# Patient Record
Sex: Male | Born: 1935
Health system: Southern US, Community
[De-identification: ages and names within clinical notes are randomized; demographics above are authoritative.]

## PROBLEM LIST (undated history)

## (undated) DIAGNOSIS — M1990 Unspecified osteoarthritis, unspecified site: Secondary | ICD-10-CM

## (undated) DIAGNOSIS — I10 Essential (primary) hypertension: Secondary | ICD-10-CM

## (undated) DIAGNOSIS — G4730 Sleep apnea, unspecified: Secondary | ICD-10-CM

## (undated) HISTORY — PX: OTHER SURGICAL HISTORY: SHX169

## (undated) HISTORY — PX: LIPOMA EXCISION: SHX5283

---

## 1998-11-28 ENCOUNTER — Encounter: Admission: RE | Admit: 1998-11-28 | Discharge: 1999-02-26 | Payer: Self-pay | Admitting: General Practice

## 1998-12-13 ENCOUNTER — Encounter: Payer: Self-pay | Admitting: Surgery

## 1998-12-13 ENCOUNTER — Ambulatory Visit (HOSPITAL_COMMUNITY): Admission: RE | Admit: 1998-12-13 | Discharge: 1998-12-13 | Payer: Self-pay | Admitting: Surgery

## 2003-02-16 ENCOUNTER — Emergency Department (HOSPITAL_COMMUNITY): Admission: EM | Admit: 2003-02-16 | Discharge: 2003-02-16 | Payer: Self-pay | Admitting: Emergency Medicine

## 2005-03-11 ENCOUNTER — Emergency Department (HOSPITAL_COMMUNITY): Admission: EM | Admit: 2005-03-11 | Discharge: 2005-03-12 | Payer: Self-pay | Admitting: Emergency Medicine

## 2005-03-20 ENCOUNTER — Ambulatory Visit (HOSPITAL_COMMUNITY): Admission: RE | Admit: 2005-03-20 | Discharge: 2005-03-20 | Payer: Self-pay | Admitting: General Surgery

## 2010-03-24 ENCOUNTER — Emergency Department (HOSPITAL_COMMUNITY)
Admission: EM | Admit: 2010-03-24 | Discharge: 2010-03-25 | Payer: Self-pay | Source: Home / Self Care | Admitting: Emergency Medicine

## 2010-04-01 LAB — BASIC METABOLIC PANEL
BUN: 10 mg/dL (ref 6–23)
CO2: 27 mEq/L (ref 19–32)
Calcium: 8.7 mg/dL (ref 8.4–10.5)
Chloride: 101 mEq/L (ref 96–112)
Creatinine, Ser: 1.12 mg/dL (ref 0.4–1.5)
GFR calc Af Amer: >60 mL/min (ref >60)
GFR calc non Af Amer: >60 mL/min (ref >60)
Glucose, Bld: 164 mg/dL — ABNORMAL HIGH (ref 70–99)
Potassium: 3.4 mEq/L — ABNORMAL LOW (ref 3.5–5.1)
Sodium: 136 mEq/L (ref 135–145)

## 2010-04-01 LAB — DIFFERENTIAL
Basophils Absolute: 0 10*3/uL (ref 0.0–0.1)
Basophils Relative: 0 % (ref 0–1)
Eosinophils Absolute: 0.2 10*3/uL (ref 0.0–0.7)
Eosinophils Relative: 3 % (ref 0–5)
Lymphocytes Relative: 12 % (ref 12–46)
Lymphs Abs: 0.8 10*3/uL (ref 0.7–4.0)
Monocytes Absolute: 0.5 10*3/uL (ref 0.1–1.0)
Monocytes Relative: 7 % (ref 3–12)
Neutro Abs: 4.7 10*3/uL (ref 1.7–7.7)
Neutrophils Relative %: 77 % (ref 43–77)

## 2010-04-01 LAB — CBC
HCT: 42.2 % (ref 39.0–52.0)
Hemoglobin: 14.3 g/dL (ref 13.0–17.0)
MCH: 29.7 pg (ref 26.0–34.0)
MCHC: 33.9 g/dL (ref 30.0–36.0)
MCV: 87.7 fL (ref 78.0–100.0)
Platelets: 180 10*3/uL (ref 150–400)
RBC: 4.81 MIL/uL (ref 4.22–5.81)
RDW: 13.7 % (ref 11.5–15.5)
WBC: 6.1 10*3/uL (ref 4.0–10.5)

## 2011-06-17 ENCOUNTER — Encounter (HOSPITAL_COMMUNITY): Payer: Self-pay | Admitting: Pharmacy Technician

## 2011-06-17 NOTE — Patient Instructions (Signed)
[address]  06/17/2011   Your procedure is scheduled on:   06/19/2011  Report to [facility] at  1215  AM.  Call this number if you have problems the morning of surgery: [phone]   Remember:   Do not eat food:After Midnight.  May have clear liquids:until Midnight .  Clear liquids include soda, tea, black coffee, apple or grape juice, broth.  Take these medicines the morning of surgery with A SIP OF WATER: cardura,vasotec,benicar   Do not wear jewelry, make-up or nail polish.  Do not wear lotions, powders, or perfumes. You may wear deodorant.  Do not shave 48 hours prior to surgery.  Do not bring valuables to the hospital.  Contacts, dentures or bridgework may not be worn into surgery.  Leave suitcase in the car. After surgery it may be brought to your room.  For patients admitted to the hospital, checkout time is 11:00 AM the day of discharge.   Patients discharged the day of surgery will not be allowed to drive home.  Name and phone number of your driver: family  Special Instructions: N/A   Please read over the following fact sheets that you were given: Pain Booklet, Surgical Site Infection Prevention, Anesthesia Post-op Instructions and Care and Recovery After Surgery Cataract A cataract is a clouding of the lens of the eye. When a lens becomes cloudy, vision is reduced based on the degree and nature of the clouding. Many cataracts reduce vision to some degree. Some cataracts make people more near-sighted as they develop. Other cataracts increase glare. Cataracts that are ignored and become worse can sometimes look white. The white color can be seen through the pupil. CAUSES   Aging. However, cataracts may occur at any age, even in newborns.   Certain drugs.   Trauma to the eye.   Certain diseases such as diabetes.   Specific eye diseases such as chronic inflammation inside the eye or a sudden attack of a rare form of glaucoma.   Inherited or acquired medical problems.    SYMPTOMS   Gradual, progressive drop in vision in the affected eye.   Severe, rapid visual loss. This most often happens when trauma is the cause.  DIAGNOSIS  To detect a cataract, an eye doctor examines the lens. Cataracts are best diagnosed with an exam of the eyes with the pupils enlarged (dilated) by drops.  TREATMENT  For an early cataract, vision may improve by using different eyeglasses or stronger lighting. If that does not help your vision, surgery is the only effective treatment. A cataract needs to be surgically removed when vision loss interferes with your everyday activities, such as driving, reading, or watching TV. A cataract may also have to be removed if it prevents examination or treatment of another eye problem. Surgery removes the cloudy lens and usually replaces it with a substitute lens (intraocular lens, IOL).  At a time when both you and your doctor agree, the cataract will be surgically removed. If you have cataracts in both eyes, only one is usually removed at a time. This allows the operated eye to heal and be out of danger from any possible problems after surgery (such as infection or poor wound healing). In rare cases, a cataract may be doing damage to your eye. In these cases, your caregiver may advise surgical removal right away. The vast majority of people who have cataract surgery have better vision afterward. HOME CARE INSTRUCTIONS  If you are not planning surgery, you may be  asked to do the following:  Use different eyeglasses.   Use stronger or brighter lighting.   Ask your eye doctor about reducing your medicine dose or changing medicines if it is thought that a medicine caused your cataract. Changing medicines does not make the cataract go away on its own.   Become familiar with your surroundings. Poor vision can lead to injury. Avoid bumping into things on the affected side. You are at a higher risk for tripping or falling.   Exercise extreme care when  driving or operating machinery.   Wear sunglasses if you are sensitive to bright light or experiencing problems with glare.  SEEK IMMEDIATE MEDICAL CARE IF:   You have a worsening or sudden vision loss.   You notice redness, swelling, or increasing pain in the eye.   You have a fever.  Document Released: 03/03/2005 Document Revised: 02/20/2011 Document Reviewed: 10/25/2010 [facility] Patient Information 2012 [place].PATIENT INSTRUCTIONS POST-ANESTHESIA  IMMEDIATELY FOLLOWING SURGERY:  Do not drive or operate machinery for the first twenty four hours after surgery.  Do not make any important decisions for twenty four hours after surgery or while taking narcotic pain medications or sedatives.  If you develop intractable nausea and vomiting or a severe headache please notify your doctor immediately.  FOLLOW-UP:  Please make an appointment with your surgeon as instructed. You do not need to follow up with anesthesia unless specifically instructed to do so.  WOUND CARE INSTRUCTIONS (if applicable):  Keep a dry clean dressing on the anesthesia/puncture wound site if there is drainage.  Once the wound has quit draining you may leave it open to air.  Generally you should leave the bandage intact for twenty four hours unless there is drainage.  If the epidural site drains for more than 36-48 hours please call the anesthesia department.  QUESTIONS?:  Please feel free to call your physician or the hospital operator if you have any questions, and they will be happy to assist you.     [place] [phone]

## 2011-06-18 ENCOUNTER — Other Ambulatory Visit: Payer: Self-pay

## 2011-06-18 ENCOUNTER — Encounter (HOSPITAL_COMMUNITY)
Admission: RE | Admit: 2011-06-18 | Discharge: 2011-06-18 | Disposition: A | Discharge status: Home / Self Care | Payer: Medicare Other | Source: Ambulatory Visit | Attending: Ophthalmology | Admitting: Ophthalmology

## 2011-06-18 ENCOUNTER — Encounter (HOSPITAL_COMMUNITY): Payer: Self-pay

## 2011-06-18 HISTORY — DX: Unspecified osteoarthritis, unspecified site: M19.90

## 2011-06-18 HISTORY — DX: Essential (primary) hypertension: I10

## 2011-06-18 HISTORY — DX: Sleep apnea, unspecified: G47.30

## 2011-06-18 LAB — BASIC METABOLIC PANEL
BUN: 16 mg/dL (ref 6–23)
CO2: 26 mEq/L (ref 19–32)
Calcium: 9.1 mg/dL (ref 8.4–10.5)
Chloride: 105 mEq/L (ref 96–112)
Creatinine, Ser: 1.04 mg/dL (ref 0.50–1.35)
GFR calc Af Amer: 80 mL/min — ABNORMAL LOW (ref >90)
GFR calc non Af Amer: 69 mL/min — ABNORMAL LOW (ref >90)
Glucose, Bld: 145 mg/dL — ABNORMAL HIGH (ref 70–99)
Potassium: 3.6 mEq/L (ref 3.5–5.1)
Sodium: 141 mEq/L (ref 135–145)

## 2011-06-18 LAB — HEMOGLOBIN AND HEMATOCRIT, BLOOD
HCT: 39.5 % (ref 39.0–52.0)
Hemoglobin: 13.3 g/dL (ref 13.0–17.0)

## 2011-06-18 NOTE — Progress Notes (Signed)
06/18/11 1204  OBSTRUCTIVE SLEEP APNEA  Score 4 or greater  Updated health history

## 2011-06-19 ENCOUNTER — Ambulatory Visit (HOSPITAL_COMMUNITY): Payer: Medicare Other | Admitting: Anesthesiology

## 2011-06-19 ENCOUNTER — Encounter (HOSPITAL_COMMUNITY)
Admission: RE | Disposition: A | Discharge status: Home / Self Care | Payer: Self-pay | Source: Ambulatory Visit | Attending: Ophthalmology

## 2011-06-19 ENCOUNTER — Ambulatory Visit (HOSPITAL_COMMUNITY)
Admission: RE | Admit: 2011-06-19 | Discharge: 2011-06-19 | Disposition: A | Discharge status: Home / Self Care | Payer: Medicare Other | Source: Ambulatory Visit | Attending: Ophthalmology | Admitting: Ophthalmology

## 2011-06-19 ENCOUNTER — Encounter (HOSPITAL_COMMUNITY): Payer: Self-pay | Admitting: Anesthesiology

## 2011-06-19 ENCOUNTER — Encounter (HOSPITAL_COMMUNITY): Payer: Self-pay | Admitting: *Deleted

## 2011-06-19 DIAGNOSIS — Z01810 Encounter for preprocedural cardiovascular examination: Secondary | ICD-10-CM | POA: Insufficient documentation

## 2011-06-19 DIAGNOSIS — H2589 Other age-related cataract: Secondary | ICD-10-CM | POA: Insufficient documentation

## 2011-06-19 DIAGNOSIS — Z01812 Encounter for preprocedural laboratory examination: Secondary | ICD-10-CM | POA: Insufficient documentation

## 2011-06-19 DIAGNOSIS — I10 Essential (primary) hypertension: Secondary | ICD-10-CM | POA: Insufficient documentation

## 2011-06-19 DIAGNOSIS — Z79899 Other long term (current) drug therapy: Secondary | ICD-10-CM | POA: Insufficient documentation

## 2011-06-19 DIAGNOSIS — E119 Type 2 diabetes mellitus without complications: Secondary | ICD-10-CM | POA: Insufficient documentation

## 2011-06-19 HISTORY — PX: CATARACT EXTRACTION W/PHACO: SHX586

## 2011-06-19 SURGERY — PHACOEMULSIFICATION, CATARACT, WITH IOL INSERTION
Anesthesia: Monitor Anesthesia Care | Site: Eye | Laterality: Left | Wound class: Clean

## 2011-06-19 MED ORDER — LIDOCAINE HCL (PF) 1 % IJ SOLN
INTRAMUSCULAR | Status: AC
  Filled 2011-06-19: qty 2

## 2011-06-19 MED ORDER — NEOMYCIN-POLYMYXIN-DEXAMETH 0.1 % OP OINT
TOPICAL_OINTMENT | OPHTHALMIC | Status: DC | PRN
  Administered 2011-06-19: 1 via OPHTHALMIC

## 2011-06-19 MED ORDER — MIDAZOLAM HCL 2 MG/2ML IJ SOLN
1.0000 mg | INTRAMUSCULAR | Status: DC | PRN
  Administered 2011-06-19: 2 mg via INTRAVENOUS

## 2011-06-19 MED ORDER — LIDOCAINE HCL 3.5 % OP GEL
1.0000 "application " | Freq: Once | OPHTHALMIC | Status: AC
  Administered 2011-06-19: 1 via OPHTHALMIC

## 2011-06-19 MED ORDER — LIDOCAINE 3.5 % OP GEL OPTIME - NO CHARGE
OPHTHALMIC | Status: DC | PRN
  Administered 2011-06-19: 1 [drp] via OPHTHALMIC

## 2011-06-19 MED ORDER — LACTATED RINGERS IV SOLN
INTRAVENOUS | Status: DC
  Administered 2011-06-19: 1000 mL via INTRAVENOUS

## 2011-06-19 MED ORDER — MIDAZOLAM HCL 2 MG/2ML IJ SOLN
INTRAMUSCULAR | Status: AC
  Administered 2011-06-19: 2 mg via INTRAVENOUS
  Filled 2011-06-19: qty 2

## 2011-06-19 MED ORDER — BSS IO SOLN
INTRAOCULAR | Status: DC | PRN
  Administered 2011-06-19: 15 mL via INTRAOCULAR

## 2011-06-19 MED ORDER — POVIDONE-IODINE 5 % OP SOLN
OPHTHALMIC | Status: DC | PRN
  Administered 2011-06-19: 1 via OPHTHALMIC

## 2011-06-19 MED ORDER — TETRACAINE HCL 0.5 % OP SOLN
1.0000 [drp] | OPHTHALMIC | Status: AC
  Administered 2011-06-19 (×3): 1 [drp] via OPHTHALMIC

## 2011-06-19 MED ORDER — LIDOCAINE HCL 3.5 % OP GEL
OPHTHALMIC | Status: AC
  Filled 2011-06-19: qty 5

## 2011-06-19 MED ORDER — TETRACAINE HCL 0.5 % OP SOLN
OPHTHALMIC | Status: AC
  Filled 2011-06-19: qty 2

## 2011-06-19 MED ORDER — PHENYLEPHRINE HCL 2.5 % OP SOLN
1.0000 [drp] | OPHTHALMIC | Status: AC
  Administered 2011-06-19 (×3): 1 [drp] via OPHTHALMIC

## 2011-06-19 MED ORDER — NEOMYCIN-POLYMYXIN-DEXAMETH 3.5-10000-0.1 OP OINT
TOPICAL_OINTMENT | OPHTHALMIC | Status: AC
  Filled 2011-06-19: qty 3.5

## 2011-06-19 MED ORDER — ONDANSETRON HCL 4 MG/2ML IJ SOLN: 4.0000 mg | Freq: Once | INTRAMUSCULAR | Status: DC | PRN

## 2011-06-19 MED ORDER — FENTANYL CITRATE 0.05 MG/ML IJ SOLN: 25.0000 ug | INTRAMUSCULAR | Status: DC | PRN

## 2011-06-19 MED ORDER — PROVISC 10 MG/ML IO SOLN
INTRAOCULAR | Status: DC | PRN
  Administered 2011-06-19: .85 mL via INTRAOCULAR

## 2011-06-19 MED ORDER — EPINEPHRINE HCL 1 MG/ML IJ SOLN
INTRAMUSCULAR | Status: AC
  Filled 2011-06-19: qty 1

## 2011-06-19 MED ORDER — CYCLOPENTOLATE-PHENYLEPHRINE 0.2-1 % OP SOLN
OPHTHALMIC | Status: AC
  Filled 2011-06-19: qty 2

## 2011-06-19 MED ORDER — EPINEPHRINE HCL 1 MG/ML IJ SOLN
INTRAOCULAR | Status: DC | PRN
  Administered 2011-06-19: 14:00:00

## 2011-06-19 MED ORDER — PHENYLEPHRINE HCL 2.5 % OP SOLN
OPHTHALMIC | Status: AC
  Filled 2011-06-19: qty 2

## 2011-06-19 MED ORDER — DEXTROSE 50 % IV SOLN
INTRAVENOUS | Status: AC
  Filled 2011-06-19: qty 50

## 2011-06-19 MED ORDER — CYCLOPENTOLATE-PHENYLEPHRINE 0.2-1 % OP SOLN
1.0000 [drp] | OPHTHALMIC | Status: AC
  Administered 2011-06-19 (×3): 1 [drp] via OPHTHALMIC

## 2011-06-19 MED ORDER — LIDOCAINE HCL (PF) 1 % IJ SOLN
INTRAMUSCULAR | Status: DC | PRN
  Administered 2011-06-19: .6 mL

## 2011-06-19 MED ORDER — DEXTROSE 50 % IV SOLN
12.5000 g | Freq: Once | INTRAVENOUS | Status: AC
  Administered 2011-06-19: 12.5 g via INTRAVENOUS

## 2011-06-19 SURGICAL SUPPLY — 32 items
CAPSULAR TENSION RING-AMO (OPHTHALMIC RELATED) IMPLANT
CLOTH BEACON ORANGE TIMEOUT ST (SAFETY) ×1 IMPLANT
EYE SHIELD UNIVERSAL CLEAR (GAUZE/BANDAGES/DRESSINGS) ×1 IMPLANT
GLOVE BIO SURGEON STRL SZ 6.5 (GLOVE) IMPLANT
GLOVE BIOGEL PI IND STRL 6.5 (GLOVE) IMPLANT
GLOVE BIOGEL PI IND STRL 7.0 (GLOVE) IMPLANT
GLOVE BIOGEL PI IND STRL 7.5 (GLOVE) IMPLANT
GLOVE BIOGEL PI INDICATOR 6.5 (GLOVE) ×1
GLOVE BIOGEL PI INDICATOR 7.0 (GLOVE)
GLOVE BIOGEL PI INDICATOR 7.5 (GLOVE)
GLOVE ECLIPSE 6.5 STRL STRAW (GLOVE) IMPLANT
GLOVE ECLIPSE 7.0 STRL STRAW (GLOVE) ×1 IMPLANT
GLOVE ECLIPSE 7.5 STRL STRAW (GLOVE) IMPLANT
GLOVE EXAM NITRILE LRG STRL (GLOVE) IMPLANT
GLOVE EXAM NITRILE MD LF STRL (GLOVE) ×2 IMPLANT
GLOVE SKINSENSE NS SZ6.5 (GLOVE)
GLOVE SKINSENSE NS SZ7.0 (GLOVE)
GLOVE SKINSENSE STRL SZ6.5 (GLOVE) IMPLANT
GLOVE SKINSENSE STRL SZ7.0 (GLOVE) IMPLANT
KIT VITRECTOMY (OPHTHALMIC RELATED) IMPLANT
PAD ARMBOARD 7.5X6 YLW CONV (MISCELLANEOUS) ×1 IMPLANT
PROC W NO LENS (INTRAOCULAR LENS)
PROC W SPEC LENS (INTRAOCULAR LENS)
PROCESS W NO LENS (INTRAOCULAR LENS) IMPLANT
PROCESS W SPEC LENS (INTRAOCULAR LENS) IMPLANT
RING MALYGIN (MISCELLANEOUS) IMPLANT
SIGHTPATH CAT PROC W REG LENS (Ophthalmic Related) ×2 IMPLANT
SYR TB 1ML LL NO SAFETY (SYRINGE) ×2 IMPLANT
TAPE SURG TRANSPORE 1 IN (GAUZE/BANDAGES/DRESSINGS) IMPLANT
TAPE SURGICAL TRANSPORE 1 IN (GAUZE/BANDAGES/DRESSINGS) ×1
VISCOELASTIC ADDITIONAL (OPHTHALMIC RELATED) IMPLANT
WATER STERILE IRR 250ML POUR (IV SOLUTION) ×1 IMPLANT

## 2011-06-19 NOTE — Discharge Instructions (Signed)
Sean Stuart  06/19/2011     Instructions  1. Use medications as Instructed.  Shake well before use. Wait 5 minutes between drops.  {OPHTHALMIC ANTIBIOTICS:22167} 4 times a day x 1 week.  {OPHTHALMIC ANTI-INFLAMMATORY:22168} 2 times a day x 4 weeks.  {OPHTHALMIC STEROID:22169} 4 times a day - week 1   3 times a day - Week 2, 2 times a day- Week 3, 1 time a day - Week 4.  2. Do not rub the operative eye. Do not swim underwater for 2 weeks.  3. You may remove the clear shield and resume your normal activities the day after  Surgery. Your eyes may feel more comfortable if you wear dark glasses outside.  4. Call our office at [phone] if you have sudden change in vision, extreme redness or pain. Some fluctuation in vision is normal after surgery. If you have an emergency after hours, call Dr. Alto Denver at [phone].  5. It is important that you attend all of your follow-up appointments.        Follow-up:{follow up:32580} with Sean Payor, MD.   Dr. Lahoma Crocker: [phone]  Dr. Lita Mains: [phone]  Dr. Alto Denver: [phone]   If you find that you cannot contact your physician, but feel that your signs and   Symptoms warrant a physician's attention, call the Emergency Room at   [phone].   Other{NA AND WUXLKGMW:10272}.

## 2011-06-19 NOTE — Transfer of Care (Signed)
Immediate Anesthesia Transfer of Care Note  Patient: Sean Stuart  Procedure(s) Performed: Procedure(s) (LRB): CATARACT EXTRACTION PHACO AND INTRAOCULAR LENS PLACEMENT (IOC) (Left)  Patient Location: PACU and Short Stay  Anesthesia Type: MAC  Level of Consciousness: awake, alert , oriented and patient cooperative  Airway & Oxygen Therapy: Patient Spontanous Breathing  Post-op Assessment: Report given to PACU RN, Post -op Vital signs reviewed and stable and Patient moving all extremities  Post vital signs: Reviewed and stable  Complications: No apparent anesthesia complications

## 2011-06-19 NOTE — Anesthesia Postprocedure Evaluation (Signed)
  Anesthesia Post-op Note  Patient: Sean Stuart  Procedure(s) Performed: Procedure(s) (LRB): CATARACT EXTRACTION PHACO AND INTRAOCULAR LENS PLACEMENT (IOC) (Left)  Patient Location: PACU and Short Stay  Anesthesia Type: MAC  Level of Consciousness: awake, alert , oriented and patient cooperative  Airway and Oxygen Therapy: Patient Spontanous Breathing  Post-op Pain: none  Post-op Assessment: Post-op Vital signs reviewed, Patient's Cardiovascular Status Stable, Respiratory Function Stable, Patent Airway and No signs of Nausea or vomiting  Post-op Vital Signs: Reviewed and stable  Complications: No apparent anesthesia complications

## 2011-06-19 NOTE — H&P (Signed)
I have reviewed the H&P, the patient was re-examined, and I have identified no interval changes in medical condition and plan of care since the history and physical of record  

## 2011-06-19 NOTE — Brief Op Note (Signed)
Pre-Op Dx: Cataract OS Post-Op Dx: Cataract OS Surgeon: Tavari Loadholt Anesthesia: Topical with MAC Implant: Lenstec, Model Softec HD Specimen: None Complications: None 

## 2011-06-19 NOTE — Anesthesia Preprocedure Evaluation (Signed)
Anesthesia Evaluation  Patient identified by MRN, date of birth, ID band Patient awake    Reviewed: Allergy & Precautions, H&P , NPO status , Patient's Chart, lab work & pertinent test results  Airway Mallampati: II  Neck ROM: Full    Dental  (+) Teeth Intact   Pulmonary asthma , sleep apnea ,  breath sounds clear to auscultation        Cardiovascular hypertension, Pt. on medications Rhythm:Regular Rate:Normal     Neuro/Psych    GI/Hepatic   Endo/Other  Diabetes mellitus-, Well Controlled, Type 2, Oral Hypoglycemic Agents  Renal/GU      Musculoskeletal   Abdominal   Peds  Hematology   Anesthesia Other Findings   Reproductive/Obstetrics                           Anesthesia Physical Anesthesia Plan  ASA: III  Anesthesia Plan: MAC   Post-op Pain Management:    Induction: Intravenous  Airway Management Planned: Nasal Cannula  Additional Equipment:   Intra-op Plan:   Post-operative Plan:   Informed Consent: I have reviewed the patients History and Physical, chart, labs and discussed the procedure including the risks, benefits and alternatives for the proposed anesthesia with the patient or authorized representative who has indicated his/her understanding and acceptance.     Plan Discussed with:   Anesthesia Plan Comments:         Anesthesia Quick Evaluation  

## 2011-06-20 NOTE — Op Note (Signed)
[name], Sean Stuart            ACCOUNT NO.:  [id number]  MEDICAL RECORD NO.:  [id number]  LOCATION:  [facility]                          FACILITY:  [facility]  PHYSICIAN:  Susanne Greenhouse, MD       DATE OF BIRTH:  [birth year]  DATE OF PROCEDURE:  06/19/2011 DATE OF DISCHARGE:  06/19/2011                              OPERATIVE REPORT   PREOPERATIVE DIAGNOSIS:  Combined cataract, left eye, diagnosis code 366.19.  POSTOPERATIVE DIAGNOSIS:  Combined cataract, left eye, diagnosis code 366.19.  SURGEON:  Bonne Dolores. Yaelis Scharfenberg, MD  ANESTHESIA:  Topical with monitored anesthesia care.  DESCRIPTION OF THE OPERATION:  In the preoperative holding area, dilating drop and viscous lidocaine were placed into the left eye.  The patient was then brought to the operating room where she was prepped and draped.  Beginning with a #75 blade, a paracentesis port was made at the surgeon's 2 o'clock position.  The anterior chamber was filled with a 1% nonpreserved lidocaine solution.  Because of poor visualization of the red reflex, the anterior chamber was filled with VisionBlue and the VisionBlue was then rinsed from the anterior chamber with balanced salt solution.  The anterior chamber was then filled with Provisc.  A 2.4-mm keratome blade was then used to make a clear corneal incision at the temporal limbus.  A bent cystotome needle was used to create a continuous tear capsulotomy.  Hydrodissection was performed with balanced salt solution and a fine cannula.  The lens nucleus was then removed using phacoemulsification and a quadrant cracking technique. Residual cortex was removed with irrigation and aspiration.  A capsular bag and anterior chamber were refilled with Provisc and a posterior chamber intraocular lens was placed into the capsular bag without difficulty using its lens injecting system.  The Provisc was then removed from the capsular bag and anterior chamber with irrigation and aspiration.  Stromal  hydration of the main incision and paracentesis ports was performed with balanced salt solution and a fine cannula.  The wounds were tested for leak, which were negative.  The patient tolerated the procedure well.  There were no operative complications and she was returned to the recovery area in satisfactory condition.  No surgical specimens.  Prosthetic device used is a Lenstec posterior chamber lens, model Softec HD, power of 20.5, serial number is [id number].          ______________________________ Susanne Greenhouse, MD    KEH/MEDQ  D:  06/19/2011  T:  06/20/2011  Job:  [id number]

## 2011-06-23 ENCOUNTER — Encounter (HOSPITAL_COMMUNITY): Payer: Self-pay | Admitting: Ophthalmology

## 2011-07-01 ENCOUNTER — Encounter (HOSPITAL_COMMUNITY): Payer: Self-pay

## 2011-07-02 ENCOUNTER — Encounter (HOSPITAL_COMMUNITY)
Admission: RE | Admit: 2011-07-02 | Discharge: 2011-07-02 | Payer: Medicare Other | Source: Ambulatory Visit | Admitting: Ophthalmology

## 2011-07-04 MED ORDER — TETRACAINE HCL 0.5 % OP SOLN
OPHTHALMIC | Status: AC
  Filled 2011-07-04: qty 2

## 2011-07-04 MED ORDER — NEOMYCIN-POLYMYXIN-DEXAMETH 3.5-10000-0.1 OP OINT
TOPICAL_OINTMENT | OPHTHALMIC | Status: AC
  Filled 2011-07-04: qty 3.5

## 2011-07-04 MED ORDER — LIDOCAINE HCL 3.5 % OP GEL
OPHTHALMIC | Status: AC
  Filled 2011-07-04: qty 5

## 2011-07-04 MED ORDER — PHENYLEPHRINE HCL 2.5 % OP SOLN
OPHTHALMIC | Status: AC
  Filled 2011-07-04: qty 2

## 2011-07-04 MED ORDER — CYCLOPENTOLATE-PHENYLEPHRINE 0.2-1 % OP SOLN
OPHTHALMIC | Status: AC
  Filled 2011-07-04: qty 2

## 2011-07-04 MED ORDER — LIDOCAINE HCL (PF) 1 % IJ SOLN
INTRAMUSCULAR | Status: AC
  Filled 2011-07-04: qty 2

## 2011-07-07 ENCOUNTER — Ambulatory Visit (HOSPITAL_COMMUNITY)
Admission: RE | Admit: 2011-07-07 | Discharge: 2011-07-07 | Disposition: A | Discharge status: Home / Self Care | Payer: Medicare Other | Source: Ambulatory Visit | Attending: Ophthalmology | Admitting: Ophthalmology

## 2011-07-07 ENCOUNTER — Ambulatory Visit (HOSPITAL_COMMUNITY): Payer: Medicare Other | Admitting: Anesthesiology

## 2011-07-07 ENCOUNTER — Encounter (HOSPITAL_COMMUNITY): Payer: Self-pay | Admitting: Anesthesiology

## 2011-07-07 ENCOUNTER — Encounter (HOSPITAL_COMMUNITY)
Admission: RE | Disposition: A | Discharge status: Home / Self Care | Payer: Self-pay | Source: Ambulatory Visit | Attending: Ophthalmology

## 2011-07-07 ENCOUNTER — Encounter (HOSPITAL_COMMUNITY): Payer: Self-pay | Admitting: *Deleted

## 2011-07-07 DIAGNOSIS — I10 Essential (primary) hypertension: Secondary | ICD-10-CM | POA: Insufficient documentation

## 2011-07-07 DIAGNOSIS — Z01812 Encounter for preprocedural laboratory examination: Secondary | ICD-10-CM | POA: Insufficient documentation

## 2011-07-07 DIAGNOSIS — G4733 Obstructive sleep apnea (adult) (pediatric): Secondary | ICD-10-CM | POA: Insufficient documentation

## 2011-07-07 DIAGNOSIS — H2589 Other age-related cataract: Secondary | ICD-10-CM | POA: Insufficient documentation

## 2011-07-07 DIAGNOSIS — Z79899 Other long term (current) drug therapy: Secondary | ICD-10-CM | POA: Insufficient documentation

## 2011-07-07 DIAGNOSIS — E119 Type 2 diabetes mellitus without complications: Secondary | ICD-10-CM | POA: Insufficient documentation

## 2011-07-07 HISTORY — PX: CATARACT EXTRACTION W/PHACO: SHX586

## 2011-07-07 SURGERY — PHACOEMULSIFICATION, CATARACT, WITH IOL INSERTION
Anesthesia: Monitor Anesthesia Care | Site: Eye | Laterality: Right | Wound class: Clean

## 2011-07-07 MED ORDER — NEOMYCIN-POLYMYXIN-DEXAMETH 0.1 % OP OINT
TOPICAL_OINTMENT | OPHTHALMIC | Status: DC | PRN
  Administered 2011-07-07: 1 via OPHTHALMIC

## 2011-07-07 MED ORDER — MIDAZOLAM HCL 2 MG/2ML IJ SOLN
INTRAMUSCULAR | Status: AC
  Filled 2011-07-07: qty 2

## 2011-07-07 MED ORDER — ONDANSETRON HCL 4 MG/2ML IJ SOLN: 4.0000 mg | Freq: Once | INTRAMUSCULAR | Status: DC | PRN

## 2011-07-07 MED ORDER — LIDOCAINE 3.5 % OP GEL OPTIME - NO CHARGE
OPHTHALMIC | Status: DC | PRN
  Administered 2011-07-07: 2 [drp] via OPHTHALMIC

## 2011-07-07 MED ORDER — MIDAZOLAM HCL 2 MG/2ML IJ SOLN
1.0000 mg | INTRAMUSCULAR | Status: DC | PRN
  Administered 2011-07-07: 2 mg via INTRAVENOUS

## 2011-07-07 MED ORDER — LIDOCAINE HCL 3.5 % OP GEL
1.0000 "application " | Freq: Once | OPHTHALMIC | Status: AC
  Administered 2011-07-07: 1 via OPHTHALMIC

## 2011-07-07 MED ORDER — ACETAZOLAMIDE 250 MG PO TABS
500.0000 mg | ORAL_TABLET | Freq: Once | ORAL | Status: AC
  Administered 2011-07-07: 500 mg via ORAL

## 2011-07-07 MED ORDER — TETRACAINE HCL 0.5 % OP SOLN
1.0000 [drp] | OPHTHALMIC | Status: AC
  Administered 2011-07-07 (×3): 1 [drp] via OPHTHALMIC

## 2011-07-07 MED ORDER — EPINEPHRINE HCL 1 MG/ML IJ SOLN
INTRAMUSCULAR | Status: AC
  Filled 2011-07-07: qty 1

## 2011-07-07 MED ORDER — LACTATED RINGERS IV SOLN
INTRAVENOUS | Status: DC
  Administered 2011-07-07: 1000 mL via INTRAVENOUS

## 2011-07-07 MED ORDER — BSS IO SOLN
INTRAOCULAR | Status: DC | PRN
  Administered 2011-07-07: 15 mL via INTRAOCULAR

## 2011-07-07 MED ORDER — ACETAZOLAMIDE 250 MG PO TABS
ORAL_TABLET | ORAL | Status: AC
  Filled 2011-07-07: qty 2

## 2011-07-07 MED ORDER — EPINEPHRINE HCL 1 MG/ML IJ SOLN
INTRAOCULAR | Status: DC | PRN
  Administered 2011-07-07: 11:00:00

## 2011-07-07 MED ORDER — POVIDONE-IODINE 5 % OP SOLN
OPHTHALMIC | Status: DC | PRN
  Administered 2011-07-07: 1 via OPHTHALMIC

## 2011-07-07 MED ORDER — CYCLOPENTOLATE-PHENYLEPHRINE 0.2-1 % OP SOLN
1.0000 [drp] | OPHTHALMIC | Status: AC
  Administered 2011-07-07 (×3): 1 [drp] via OPHTHALMIC

## 2011-07-07 MED ORDER — FENTANYL CITRATE 0.05 MG/ML IJ SOLN: 25.0000 ug | INTRAMUSCULAR | Status: DC | PRN

## 2011-07-07 MED ORDER — PROVISC 10 MG/ML IO SOLN
INTRAOCULAR | Status: DC | PRN
  Administered 2011-07-07: 8.5 mg via INTRAOCULAR

## 2011-07-07 MED ORDER — PHENYLEPHRINE HCL 2.5 % OP SOLN
1.0000 [drp] | OPHTHALMIC | Status: AC
  Administered 2011-07-07 (×3): 1 [drp] via OPHTHALMIC

## 2011-07-07 MED ORDER — LIDOCAINE HCL (PF) 1 % IJ SOLN
INTRAMUSCULAR | Status: DC | PRN
  Administered 2011-07-07: .5 mL

## 2011-07-07 SURGICAL SUPPLY — 32 items
CAPSULAR TENSION RING-AMO (OPHTHALMIC RELATED) IMPLANT
CLOTH BEACON ORANGE TIMEOUT ST (SAFETY) ×1 IMPLANT
EYE SHIELD UNIVERSAL CLEAR (GAUZE/BANDAGES/DRESSINGS) ×2 IMPLANT
GLOVE BIO SURGEON STRL SZ 6.5 (GLOVE) IMPLANT
GLOVE BIOGEL PI IND STRL 6.5 (GLOVE) IMPLANT
GLOVE BIOGEL PI IND STRL 7.0 (GLOVE) IMPLANT
GLOVE BIOGEL PI IND STRL 7.5 (GLOVE) IMPLANT
GLOVE BIOGEL PI INDICATOR 6.5 (GLOVE) ×1
GLOVE BIOGEL PI INDICATOR 7.0 (GLOVE)
GLOVE BIOGEL PI INDICATOR 7.5 (GLOVE)
GLOVE ECLIPSE 6.5 STRL STRAW (GLOVE) IMPLANT
GLOVE ECLIPSE 7.0 STRL STRAW (GLOVE) IMPLANT
GLOVE ECLIPSE 7.5 STRL STRAW (GLOVE) IMPLANT
GLOVE EXAM NITRILE LRG STRL (GLOVE) IMPLANT
GLOVE EXAM NITRILE MD LF STRL (GLOVE) ×1 IMPLANT
GLOVE SKINSENSE NS SZ6.5 (GLOVE)
GLOVE SKINSENSE NS SZ7.0 (GLOVE)
GLOVE SKINSENSE STRL SZ6.5 (GLOVE) IMPLANT
GLOVE SKINSENSE STRL SZ7.0 (GLOVE) IMPLANT
KIT VITRECTOMY (OPHTHALMIC RELATED) IMPLANT
PAD ARMBOARD 7.5X6 YLW CONV (MISCELLANEOUS) ×1 IMPLANT
PROC W NO LENS (INTRAOCULAR LENS)
PROC W SPEC LENS (INTRAOCULAR LENS)
PROCESS W NO LENS (INTRAOCULAR LENS) IMPLANT
PROCESS W SPEC LENS (INTRAOCULAR LENS) IMPLANT
RING MALYGIN (MISCELLANEOUS) IMPLANT
SIGHTPATH CAT PROC W REG LENS (Ophthalmic Related) ×2 IMPLANT
SYR TB 1ML LL NO SAFETY (SYRINGE) ×1 IMPLANT
TAPE SURG TRANSPORE 1 IN (GAUZE/BANDAGES/DRESSINGS) IMPLANT
TAPE SURGICAL TRANSPORE 1 IN (GAUZE/BANDAGES/DRESSINGS) ×1
VISCOELASTIC ADDITIONAL (OPHTHALMIC RELATED) IMPLANT
WATER STERILE IRR 250ML POUR (IV SOLUTION) ×1 IMPLANT

## 2011-07-07 NOTE — H&P (Signed)
I have reviewed the H&P, the patient was re-examined, and I have identified no interval changes in medical condition and plan of care since the history and physical of record  

## 2011-07-07 NOTE — Transfer of Care (Signed)
Immediate Anesthesia Transfer of Care Note  Patient: Sean Stuart  Procedure(s) Performed: Procedure(s) (LRB): CATARACT EXTRACTION PHACO AND INTRAOCULAR LENS PLACEMENT (IOC) (Right)  Patient Location: PACU and Short Stay  Anesthesia Type: MAC  Level of Consciousness: awake  Airway & Oxygen Therapy: Patient Spontanous Breathing  Post-op Assessment: Report given to PACU RN  Post vital signs: Reviewed and stable  Complications: No apparent anesthesia complications

## 2011-07-07 NOTE — Discharge Instructions (Signed)
Sean Stuart  07/07/2011     Instructions  1. Use medications as Instructed.  Shake well before use. Wait 5 minutes between drops.  {OPHTHALMIC ANTIBIOTICS:22167} 4 times a day x 1 week.  {OPHTHALMIC ANTI-INFLAMMATORY:22168} 2 times a day x 4 weeks.  {OPHTHALMIC STEROID:22169} 4 times a day - week 1   3 times a day - Week 2, 2 times a day- Week 3, 1 time a day - Week 4.  2. Do not rub the operative eye. Do not swim underwater for 2 weeks.  3. You may remove the clear shield and resume your normal activities the day after  Surgery. Your eyes may feel more comfortable if you wear dark glasses outside.  4. Call our office at [phone] if you have sudden change in vision, extreme redness or pain. Some fluctuation in vision is normal after surgery. If you have an emergency after hours, call Dr. Alto Denver at [phone].  5. It is important that you attend all of your follow-up appointments.        Follow-up:{follow up:32580} with Gemma Payor, MD.   Dr. Lahoma Crocker: [phone]  Dr. Lita Mains: [phone]  Dr. Alto Denver: [phone]   If you find that you cannot contact your physician, but feel that your signs and   Symptoms warrant a physician's attention, call the Emergency Room at   [phone].   Cataract Surgery Care After Refer to this sheet in the next few weeks. These instructions provide you with information on caring for yourself after your procedure. Your caregiver may also give you more specific instructions. Your treatment has been planned according to current medical practices, but problems sometimes occur. Call your caregiver if you have any problems or questions after your procedure.  HOME CARE INSTRUCTIONS   Avoid strenuous activities as directed by your caregiver.   Ask your caregiver when you can resume driving.   Use eyedrops or other medicines to help healing and control pressure inside your eye as directed by your caregiver.   Only take over-the-counter or  prescription medicines for pain, discomfort, or fever as directed by your caregiver.   Do not to touch or rub your eyes.   You may be instructed to use a protective shield during the first few days and nights after surgery. If not, wear sunglasses to protect your eyes. This is to protect the eye from pressure or from being accidentally bumped.   Keep the area around your eye clean and dry. Avoid swimming or allowing water to hit you directly in the face while showering. Keep soap and shampoo out of your eyes.   Do not bend or lift heavy objects. Bending increases pressure in the eye. You can walk, climb stairs, and do light household chores.   Do not put a contact lens into the eye that had surgery until your caregiver says it is okay to do so.   Ask your doctor when you can return to work. This will depend on the kind of work that you do. If you work in a dusty environment, you may be advised to wear protective eyewear for a period of time.   Ask your caregiver when it will be safe to engage in sexual activity.   Continue with your regular eye exams as directed by your caregiver.  What to expect:  It is normal to feel itching and mild discomfort for a few days after cataract surgery. Some fluid discharge is also common, and your eye may be sensitive to light and  touch.   After 1 to 2 days, even moderate discomfort should disappear. In most cases, healing will take about 6 weeks.   If you received an intraocular lens (IOL), you may notice that colors are very bright or have a blue tinge. Also, if you have been in bright sunlight, everything may appear reddish for a few hours. If you see these color tinges, it is because your lens is clear and no longer cloudy. Within a few months after receiving an IOL, these extra colors should go away. When you have healed, you will probably need new glasses.  SEEK MEDICAL CARE IF:   You have increased bruising around your eye.   You have discomfort not  helped by medicine.  SEEK IMMEDIATE MEDICAL CARE IF:   You have a fever.   You have a worsening or sudden vision loss.   You have redness, swelling, or increasing pain in the eye.   You have a thick discharge from the eye that had surgery.  MAKE SURE YOU:  Understand these instructions.   Will watch your condition.   Will get help right away if you are not doing well or get worse.  Document Released: 09/20/2004 Document Revised: 02/20/2011 Document Reviewed: 10/25/2010 [facility] Patient Information 2012 [place], [place].

## 2011-07-07 NOTE — Brief Op Note (Signed)
Pre-Op Dx: Cataract OD Post-Op Dx: Cataract OD Surgeon: Laurianne Floresca Anesthesia: Topical with MAC Implant: Lenstec, Model Softec HD Blood Loss: None Specimen: None Complications: None 

## 2011-07-07 NOTE — Anesthesia Preprocedure Evaluation (Signed)
Anesthesia Evaluation  Patient identified by MRN, date of birth, ID band Patient awake    Reviewed: Allergy & Precautions, H&P , NPO status , Patient's Chart, lab work & pertinent test results  Airway Mallampati: II  Neck ROM: Full    Dental  (+) Teeth Intact   Pulmonary asthma , sleep apnea ,  breath sounds clear to auscultation        Cardiovascular hypertension, Pt. on medications Rhythm:Regular Rate:Normal     Neuro/Psych    GI/Hepatic   Endo/Other  Diabetes mellitus-, Well Controlled, Type 2, Oral Hypoglycemic Agents  Renal/GU      Musculoskeletal   Abdominal   Peds  Hematology   Anesthesia Other Findings   Reproductive/Obstetrics                           Anesthesia Physical Anesthesia Plan  ASA: III  Anesthesia Plan: MAC   Post-op Pain Management:    Induction: Intravenous  Airway Management Planned: Nasal Cannula  Additional Equipment:   Intra-op Plan:   Post-operative Plan:   Informed Consent: I have reviewed the patients History and Physical, chart, labs and discussed the procedure including the risks, benefits and alternatives for the proposed anesthesia with the patient or authorized representative who has indicated his/her understanding and acceptance.     Plan Discussed with:   Anesthesia Plan Comments:         Anesthesia Quick Evaluation

## 2011-07-07 NOTE — Anesthesia Postprocedure Evaluation (Signed)
  Anesthesia Post-op Note  Patient: Sean Stuart  Procedure(s) Performed: Procedure(s) (LRB): CATARACT EXTRACTION PHACO AND INTRAOCULAR LENS PLACEMENT (IOC) (Right)  Patient Location: PACU and Short Stay  Anesthesia Type: MAC  Level of Consciousness: awake, alert  and oriented  Airway and Oxygen Therapy: Patient Spontanous Breathing  Post-op Pain: none  Post-op Assessment: Post-op Vital signs reviewed, Patient's Cardiovascular Status Stable, Respiratory Function Stable, Patent Airway and No signs of Nausea or vomiting  Post-op Vital Signs: Reviewed and stable  Complications: No apparent anesthesia complications

## 2011-07-09 ENCOUNTER — Encounter (HOSPITAL_COMMUNITY): Payer: Self-pay | Admitting: Ophthalmology

## 2015-10-18 ENCOUNTER — Ambulatory Visit (INDEPENDENT_AMBULATORY_CARE_PROVIDER_SITE_OTHER): Payer: Medicare Other | Admitting: Endocrinology

## 2015-10-18 ENCOUNTER — Encounter: Payer: Self-pay | Admitting: Endocrinology

## 2015-10-18 VITALS — BP 112/64 | HR 71 | Ht 76.0 in | Wt 224.0 lb

## 2015-10-18 DIAGNOSIS — E1165 Type 2 diabetes mellitus with hyperglycemia: Secondary | ICD-10-CM

## 2015-10-18 DIAGNOSIS — I10 Essential (primary) hypertension: Secondary | ICD-10-CM

## 2015-10-18 LAB — URINALYSIS, ROUTINE W REFLEX MICROSCOPIC
BILIRUBIN URINE: NEGATIVE
HGB URINE DIPSTICK: NEGATIVE
Ketones, ur: NEGATIVE
LEUKOCYTES UA: NEGATIVE
NITRITE: NEGATIVE
Specific Gravity, Urine: 1.025 (ref 1.000–1.030)
Urobilinogen, UA: 0.2 (ref 0.0–1.0)
pH: 5.5 (ref 5.0–8.0)

## 2015-10-18 LAB — COMPREHENSIVE METABOLIC PANEL
ALT: 20 U/L (ref 0–53)
AST: 19 U/L (ref 0–37)
Albumin: 4.1 g/dL (ref 3.5–5.2)
Alkaline Phosphatase: 75 U/L (ref 39–117)
BUN: 14 mg/dL (ref 6–23)
CHLORIDE: 102 meq/L (ref 96–112)
CO2: 28 meq/L (ref 19–32)
Calcium: 9.3 mg/dL (ref 8.4–10.5)
Creatinine, Ser: 1.05 mg/dL (ref 0.40–1.50)
GFR: 72.48 mL/min (ref >60.00)
GLUCOSE: 233 mg/dL — AB (ref 70–99)
POTASSIUM: 3.4 meq/L — AB (ref 3.5–5.1)
Sodium: 137 mEq/L (ref 135–145)
Total Bilirubin: 0.5 mg/dL (ref 0.2–1.2)
Total Protein: 7.7 g/dL (ref 6.0–8.3)

## 2015-10-18 LAB — MICROALBUMIN / CREATININE URINE RATIO
Creatinine,U: 206.6 mg/dL
MICROALB/CREAT RATIO: 5.6 mg/g (ref 0.0–30.0)
Microalb, Ur: 11.5 mg/dL — ABNORMAL HIGH (ref 0.0–1.9)

## 2015-10-18 LAB — LIPID PANEL
Cholesterol: 159 mg/dL (ref 0–200)
HDL: 48.7 mg/dL (ref >39.00)
LDL Cholesterol: 88 mg/dL (ref 0–99)
NONHDL: 110.26
Total CHOL/HDL Ratio: 3
Triglycerides: 111 mg/dL (ref 0.0–149.0)
VLDL: 22.2 mg/dL (ref 0.0–40.0)

## 2015-10-18 LAB — POCT GLYCOSYLATED HEMOGLOBIN (HGB A1C): HEMOGLOBIN A1C: 9.8

## 2015-10-18 LAB — GLUCOSE, POCT (MANUAL RESULT ENTRY): POC Glucose: 220 mg/dl — AB (ref 70–99)

## 2015-10-18 NOTE — Progress Notes (Signed)
Patient ID: Sean Stuart, male   DOB: 1934/06/30, 81 y.o.   MRN: [id number]           Reason for Appointment: Consultation for Type 2 Diabetes  Referring physician: None   History of Present Illness:          Date of diagnosis of type 2 diabetes mellitus: 1999        Background history:   He was diagnosed to have diabetes and treated initially with Amaryl Subsequently Actos was added and has been on the same regimen since then.  Initially had good control with this regimen but not clear what his subsequent level of control has been He does not think he has been on metformin previously  Recent history:     Non-insulin hypoglycemic drugs the patient is taking are: Amaryl 4 mg daily  Current management, blood sugar patterns and problems identified:    he thinks his blood sugars had been well controlled when he was taking Actos and Amaryl and apparently was seen by his PCP 3-4 months ago but A1c not available.  He thinks blood sugars ranged from 110-158 previously  He has not taking Actos for 2 months as he ran out and for some reason was not able to get a refill  He stopped checking his blood sugar about a month ago but previously blood sugars started going up Glucose in the office nonfasting today is over 200        Side effects from medications have been: None  Compliance with the medical regimen: Fair Hypoglycemia: None    Glucose monitoring:  done previously 1 times a day         Glucometer: generic        Self-care: The diet that the patient has been following is: tries to limit drinks with sugar .     Trying to eat more salads especially at lunchtime Eating out once or twice a week, sometimes at fast food restaurants           Dietician visit, most recent: 2007               Exercise:  mostly with playing golf about twice a week and some bowling   Weight history:  Wt Readings from Last 3 Encounters:  10/18/15 224 lb (101.6 kg)  06/18/11 230 lb (104.3 kg)     Glycemic control:  No results found for: HGBA1C Lab Results  Component Value Date   CREATININE 1.04 06/18/2011   No results found for: [facility]       Medication List       Accurate as of 10/18/15  4:21 PM. Always use your most recent med list.          amLODipine-atorvastatin 5-40 MG tablet Commonly known as:  CADUET Take 1 tablet by mouth daily.   aspirin EC 81 MG tablet Take 81 mg by mouth every morning.   bimatoprost 0.01 % Soln Commonly known as:  LUMIGAN Place 1 drop into both eyes every morning.   glimepiride 4 MG tablet Commonly known as:  AMARYL Take 4 mg by mouth daily before breakfast.   pioglitazone 15 MG tablet Commonly known as:  ACTOS Take 15 mg by mouth every morning.       Allergies: No Known Allergies  Past Medical History:  Diagnosis Date  . Arthritis    knees  . Asthma   . Diabetes mellitus   . Hypertension   . Sleep apnea  STOP BANG score 5    Past Surgical History:  Procedure Laterality Date  . CATARACT EXTRACTION W/PHACO  06/19/2011   Procedure: CATARACT EXTRACTION PHACO AND INTRAOCULAR LENS PLACEMENT (IOC);  Surgeon: Gemma Payor, MD;  Location: [facility];  Service: Ophthalmology;  Laterality: Left;  CDE: 12.83  . CATARACT EXTRACTION W/PHACO  07/07/2011   Procedure: CATARACT EXTRACTION PHACO AND INTRAOCULAR LENS PLACEMENT (IOC);  Surgeon: Gemma Payor, MD;  Location: [facility];  Service: Ophthalmology;  Laterality: Right;  CDE 12.82  . LIPOMA EXCISION    . right leg surgery     from [facility]    Family History  Problem Relation Age of Onset  . Diabetes Mother   . CAD Father   . Anesthesia problems Neg Hx   . Hypotension Neg Hx   . Malignant hyperthermia Neg Hx   . Pseudochol deficiency Neg Hx     Social History:  reports that he has quit smoking. He does not have any smokeless tobacco history on file. He reports that he does not drink alcohol or use drugs.   Review of Systems  Constitutional: Negative for weight gain and  reduced appetite.  HENT: Negative for trouble swallowing.   Eyes: Positive for visual disturbance.       Left  Cardiovascular: Negative for leg swelling and claudication.  Endocrine: Positive for breast swelling. Negative for fatigue, decreased libido and erectile dysfunction.       Left breast swelling for 3-4 years, not evaluated previously  Genitourinary: Negative for frequency.  Musculoskeletal: Positive for back pain. Negative for joint pain.  Skin: Negative for rash.  Neurological: Negative for weakness, numbness and tingling.  Psychiatric/Behavioral: Negative for insomnia.     Lipid history: Unknown.  Has never been on any medications for hyperlipidemia   No results found for: CHOL, HDL, LDLCALC, LDLDIRECT, TRIG, CHOLHDL         Hypertension: since 1999 ?Marland Kitchen  Previously on enalapril but now apparently taking Lotrel only  Most recent eye exam was 2016  Most recent foot exam: 8/17    Physical Examination:  BP 112/64 (BP Location: Left Arm, Patient Position: Sitting, Cuff Size: Normal)   Pulse 71   Ht 6\' 4"  (1.93 m)   Wt 224 lb (101.6 kg)   SpO2 94%   BMI 27.27 kg/m   GENERAL:      He is well built and nourished, minimal obesity HEENT:         Eye exam shows normal external appearance. Fundus exam shows no retinopathy.  Oral exam shows normal mucosa .  NECK:   There is no lymphadenopathy Thyroid is not enlarged and no nodules felt.  Carotids are normal to palpation and no bruit heard  Chest is symmetrical.  Left-sided gynecomastia present, 3-4 cm Lungs are clear to auscultation.Marland Kitchen   HEART:         Heart sounds:  S1 and S2 are normal. No murmur or click heard., no S3 or S4.   ABDOMEN:   There is no distention present. Liver and spleen are not palpable. No other mass or tenderness present.   NEUROLOGICAL:   Ankle jerks are absent bilaterally, biceps reflexes appear normal .    Diabetic Foot Exam - Simple   Simple Foot Form Diabetic Foot exam was performed with the  following findings:  Yes 10/18/2015  4:02 PM  Visual Inspection No deformities, no ulcerations, no other skin breakdown bilaterally:  Yes Sensation Testing Intact to touch and monofilament testing bilaterally:  Yes  Pulse Check Posterior Tibialis and Dorsalis pulse intact bilaterally:  Yes Comments            Vibration sense is Mild to moderately reduced in distal first toes. MUSCULOSKELETAL:  There is no swelling or deformity of the peripheral joints. Spine is normal to inspection.   EXTREMITIES:     There is no edema. No skin lesions present.Marland Kitchen SKIN:       No rash or lesions of concern.        ASSESSMENT:  Diabetes type 2, uncontrolled with A1c 9.8% He has had diabetes for about 18 years level of control is unknown Although he reports better controlled when he was taking Actos and Amaryl together no records are available about his level of control from his PCP Blood sugars are higher now with lab glucose over 200, he thinks this is from not taking Actos for the last 2 months    Complications of diabetes: None evident, urine microalbumin to be checked  HYPERTENSION: Appears well-controlled  Unknown lipid status  PLAN:     Restart Actos and continue Amaryl  Start metformin ER for insulin resistance and since his A1c is significantly high and may not come back to normal with Actos  He was started glucose monitoring with a One Touch Verio monitor Discussed when to check his blood sugars and to rotate timing of monitoring Discussed blood sugar targets Consultation with dietitian Encouraged him to start walking daily for exercise Follow-up in 1 month Need to check lipids and baseline chemistry panel today   Patient Instructions  Check blood sugars on waking up 2x per week    Also check blood sugars about 2 hours after a meal and do this after different meals by rotation  Recommended blood sugar levels on waking up is 90-130 and about 2 hours after meal is 130-160  Please  bring your blood sugar monitor to each visit, thank you  Walk daily  Start Metformin 1 pill daily at dinner for 1 week then 2 daily  Restart Actos    Arzell Mcgeehan 10/18/2015, 4:21 PM   Note: This office note was prepared with Dragon voice recognition system technology. Any transcriptional errors that result from this process are unintentional.

## 2015-10-18 NOTE — Patient Instructions (Signed)
Check blood sugars on waking up 2x per week    Also check blood sugars about 2 hours after a meal and do this after different meals by rotation  Recommended blood sugar levels on waking up is 90-130 and about 2 hours after meal is 130-160  Please bring your blood sugar monitor to each visit, thank you  Walk daily  Start Metformin 1 pill daily at dinner for 1 week then 2 daily  Restart Actos

## 2015-10-19 ENCOUNTER — Telehealth: Payer: Self-pay

## 2015-10-19 MED ORDER — PIOGLITAZONE HCL 15 MG PO TABS: 15.0000 mg | ORAL_TABLET | Freq: Every morning | ORAL | 2 refills | Status: DC

## 2015-10-19 MED ORDER — GLUCOSE BLOOD VI STRP: ORAL_STRIP | 2 refills | Status: DC

## 2015-10-19 MED ORDER — GLIMEPIRIDE 4 MG PO TABS: 4.0000 mg | ORAL_TABLET | Freq: Every day | ORAL | 2 refills | Status: DC

## 2015-10-19 NOTE — Telephone Encounter (Signed)
Rx for both medications sent per pt's request.

## 2015-10-19 NOTE — Telephone Encounter (Signed)
glimipiride needs to be called in to [facility] in [place] along with the actos

## 2015-10-22 ENCOUNTER — Other Ambulatory Visit: Payer: Self-pay

## 2015-10-22 MED ORDER — POTASSIUM CHLORIDE ER 10 MEQ PO TBCR: 10.0000 meq | EXTENDED_RELEASE_TABLET | Freq: Every day | ORAL | 1 refills | Status: DC

## 2015-10-22 NOTE — Telephone Encounter (Signed)
Called and left voicemail for patient to call back regarding lab results, advised patient that we sent in a kdur medication into the pharmacy. Gave call back number.

## 2015-10-22 NOTE — Progress Notes (Signed)
Please let patient know that the potassium level is low, start Klor-Con 10 mEq daily

## 2015-11-09 ENCOUNTER — Encounter: Payer: Medicare Other | Admitting: Dietician

## 2015-11-21 ENCOUNTER — Other Ambulatory Visit: Payer: Self-pay | Admitting: *Deleted

## 2015-11-21 ENCOUNTER — Other Ambulatory Visit (INDEPENDENT_AMBULATORY_CARE_PROVIDER_SITE_OTHER): Payer: Medicare Other

## 2015-11-21 DIAGNOSIS — E1165 Type 2 diabetes mellitus with hyperglycemia: Secondary | ICD-10-CM

## 2015-11-21 LAB — LIPID PANEL
CHOL/HDL RATIO: 3
Cholesterol: 152 mg/dL (ref 0–200)
HDL: 52.1 mg/dL (ref >39.00)
LDL CALC: 90 mg/dL (ref 0–99)
NonHDL: 100
TRIGLYCERIDES: 51 mg/dL (ref 0.0–149.0)
VLDL: 10.2 mg/dL (ref 0.0–40.0)

## 2015-11-21 LAB — COMPREHENSIVE METABOLIC PANEL
ALT: 15 U/L (ref 0–53)
AST: 18 U/L (ref 0–37)
Albumin: 3.7 g/dL (ref 3.5–5.2)
Alkaline Phosphatase: 64 U/L (ref 39–117)
BUN: 9 mg/dL (ref 6–23)
CALCIUM: 8.5 mg/dL (ref 8.4–10.5)
CHLORIDE: 106 meq/L (ref 96–112)
CO2: 28 meq/L (ref 19–32)
CREATININE: 0.94 mg/dL (ref 0.40–1.50)
GFR: 82.33 mL/min (ref >60.00)
Glucose, Bld: 156 mg/dL — ABNORMAL HIGH (ref 70–99)
POTASSIUM: 3.4 meq/L — AB (ref 3.5–5.1)
Sodium: 138 mEq/L (ref 135–145)
Total Bilirubin: 0.6 mg/dL (ref 0.2–1.2)
Total Protein: 7 g/dL (ref 6.0–8.3)

## 2015-11-23 ENCOUNTER — Telehealth: Payer: Self-pay | Admitting: Endocrinology

## 2015-11-23 ENCOUNTER — Other Ambulatory Visit: Payer: Self-pay | Admitting: *Deleted

## 2015-11-23 NOTE — Telephone Encounter (Signed)
Pt needs amlodipine called in to [facility] in [place] 

## 2015-11-23 NOTE — Telephone Encounter (Signed)
He needs to get this from his PCP, Dr. Lucianne MussKumar seems him for his Diabetes.

## 2015-11-26 ENCOUNTER — Ambulatory Visit: Payer: Medicare Other | Admitting: Endocrinology

## 2015-12-25 ENCOUNTER — Other Ambulatory Visit (HOSPITAL_COMMUNITY): Payer: Self-pay | Admitting: Family Medicine

## 2015-12-25 DIAGNOSIS — I719 Aortic aneurysm of unspecified site, without rupture: Secondary | ICD-10-CM

## 2015-12-27 ENCOUNTER — Ambulatory Visit (HOSPITAL_COMMUNITY)
Admission: RE | Admit: 2015-12-27 | Discharge: 2015-12-27 | Disposition: A | Discharge status: Home / Self Care | Payer: Medicare Other | Source: Ambulatory Visit | Attending: Family Medicine | Admitting: Family Medicine

## 2015-12-27 DIAGNOSIS — N281 Cyst of kidney, acquired: Secondary | ICD-10-CM | POA: Diagnosis not present

## 2015-12-27 DIAGNOSIS — I714 Abdominal aortic aneurysm, without rupture: Secondary | ICD-10-CM | POA: Insufficient documentation

## 2015-12-27 DIAGNOSIS — K769 Liver disease, unspecified: Secondary | ICD-10-CM | POA: Diagnosis not present

## 2015-12-27 DIAGNOSIS — I719 Aortic aneurysm of unspecified site, without rupture: Secondary | ICD-10-CM

## 2015-12-31 ENCOUNTER — Ambulatory Visit: Payer: Medicare Other | Admitting: Endocrinology

## 2016-01-02 ENCOUNTER — Ambulatory Visit: Payer: Medicare Other | Admitting: Endocrinology

## 2016-01-09 ENCOUNTER — Ambulatory Visit (INDEPENDENT_AMBULATORY_CARE_PROVIDER_SITE_OTHER): Payer: Medicare Other | Admitting: Endocrinology

## 2016-01-09 ENCOUNTER — Encounter: Payer: Self-pay | Admitting: Endocrinology

## 2016-01-09 VITALS — BP 132/78 | HR 71 | Ht 76.0 in | Wt 234.0 lb

## 2016-01-09 DIAGNOSIS — E1165 Type 2 diabetes mellitus with hyperglycemia: Secondary | ICD-10-CM | POA: Diagnosis not present

## 2016-01-09 LAB — COMPREHENSIVE METABOLIC PANEL
ALBUMIN: 4 g/dL (ref 3.5–5.2)
ALK PHOS: 63 U/L (ref 39–117)
ALT: 15 U/L (ref 0–53)
AST: 20 U/L (ref 0–37)
BILIRUBIN TOTAL: 0.4 mg/dL (ref 0.2–1.2)
BUN: 12 mg/dL (ref 6–23)
CO2: 27 mEq/L (ref 19–32)
CREATININE: 1.05 mg/dL (ref 0.40–1.50)
Calcium: 9.3 mg/dL (ref 8.4–10.5)
Chloride: 107 mEq/L (ref 96–112)
GFR: 72.44 mL/min (ref >60.00)
Glucose, Bld: 87 mg/dL (ref 70–99)
POTASSIUM: 3.3 meq/L — AB (ref 3.5–5.1)
SODIUM: 141 meq/L (ref 135–145)
TOTAL PROTEIN: 7.5 g/dL (ref 6.0–8.3)

## 2016-01-09 LAB — HEMOGLOBIN A1C: Hgb A1c MFr Bld: 8 % — ABNORMAL HIGH (ref 4.6–6.5)

## 2016-01-09 NOTE — Progress Notes (Signed)
Patient ID: Sean Stuart, male   DOB: 07/22/1934, 81 y.o.   MRN: [id number]           Reason for Appointment: Follow-up for Type 2 Diabetes    History of Present Illness:          Date of diagnosis of type 2 diabetes mellitus: 1999        Background history:   He was diagnosed to have diabetes and treated initially with Amaryl Subsequently Actos was added and has been on the same regimen since then.  Initially had good control with this regimen but not clear what his subsequent level of control has been He does not think he has been on metformin previously  Recent history:     Non-insulin hypoglycemic drugs the patient is taking are: Amaryl 4 mg daily, Actos 15 mg daily  Current management, blood sugar patterns and problems identified:  He was started back on Actos on his initial consultation in 10/2015 when his A1c was 9.8  He was supposed to come back in a month but is late in his return visit  He did not bring his blood sugar monitor from home, was told to restart checking on his last visit  Although he thinks his blood sugars are fairly good at home not clear how often he is monitoring  He has gained 10 pounds since his last visit  Has not had any consultation with dietitian and may be getting high-fat snacks at times.  He thinks he is generally active with playing golf sometimes and bowling        Side effects from medications have been: None  Compliance with the medical regimen: Fair Hypoglycemia: None    Glucose monitoring:  done 1 + times a day         Glucometer: ?  One Touch        readings by recall: Am 114 Pm 125-135, some at night   Self-care: The diet that the patient has been following is: tries to limit drinks with sugar .     Trying to eat more salads especially at lunchtime Eating out once or twice a week, less at fast food restaurants Snacks on cookies, chips           Dietician visit, most recent: 2007               Exercise:  mostly  with playing golf about twice a week and some bowling   Weight history:  Wt Readings from Last 3 Encounters:  01/09/16 234 lb (106.1 kg)  10/18/15 224 lb (101.6 kg)  06/18/11 230 lb (104.3 kg)    Glycemic control:   Lab Results  Component Value Date   HGBA1C 9.8 10/18/2015   Lab Results  Component Value Date   MICROALBUR 11.5 (H) 10/18/2015   LDLCALC 90 11/21/2015   CREATININE 0.94 11/21/2015   Lab Results  Component Value Date   MICRALBCREAT 5.6 10/18/2015         Medication List       Accurate as of 01/09/16  5:07 PM. Always use your most recent med list.          amLODipine-atorvastatin 5-40 MG tablet Commonly known as:  CADUET Take 1 tablet by mouth daily.   aspirin EC 81 MG tablet Take 81 mg by mouth every morning.   bimatoprost 0.01 % Soln Commonly known as:  LUMIGAN Place 1 drop into both eyes every morning.   glimepiride 4 MG tablet  Commonly known as:  AMARYL Take 1 tablet (4 mg total) by mouth daily before breakfast.   glucose blood test strip Commonly known as:  ONETOUCH VERIO Use to check blood sugar 2 times per day.   pioglitazone 15 MG tablet Commonly known as:  ACTOS Take 1 tablet (15 mg total) by mouth every morning.   potassium chloride 10 MEQ tablet Commonly known as:  K-DUR Take 1 tablet (10 mEq total) by mouth daily.       Allergies: No Known Allergies  Past Medical History:  Diagnosis Date  . Arthritis    knees  . Asthma   . Diabetes mellitus   . Hypertension   . Sleep apnea    STOP BANG score 5    Past Surgical History:  Procedure Laterality Date  . CATARACT EXTRACTION W/PHACO  06/19/2011   Procedure: CATARACT EXTRACTION PHACO AND INTRAOCULAR LENS PLACEMENT (IOC);  Surgeon: Gemma PayorKerry Hunt, MD;  Location: [facility];  Service: Ophthalmology;  Laterality: Left;  CDE: 12.83  . CATARACT EXTRACTION W/PHACO  07/07/2011   Procedure: CATARACT EXTRACTION PHACO AND INTRAOCULAR LENS PLACEMENT (IOC);  Surgeon: Gemma PayorKerry Hunt, MD;   Location: [facility];  Service: Ophthalmology;  Laterality: Right;  CDE 12.82  . LIPOMA EXCISION    . right leg surgery     from [facility]    Family History  Problem Relation Age of Onset  . Diabetes Mother   . CAD Father   . Anesthesia problems Neg Hx   . Hypotension Neg Hx   . Malignant hyperthermia Neg Hx   . Pseudochol deficiency Neg Hx     Social History:  reports that he has quit smoking. He does not have any smokeless tobacco history on file. He reports that he does not drink alcohol or use drugs.   Review of Systems    Mild hypokalemia: Not clear of the etiology, currently taking 10 mg once as a supplement for baseline level of 3.4  Lipid history: Taking Lipitor in the form of Caduet.      Lab Results  Component Value Date   CHOL 152 11/21/2015   HDL 52.10 11/21/2015   LDLCALC 90 11/21/2015   TRIG 51.0 11/21/2015   CHOLHDL 3 11/21/2015           Hypertension: since 1999 ? He is taking amlodipine in his Caduet  Most recent eye exam was 9/17   Most recent foot exam: 8/17    Physical Examination:  BP 132/78   Pulse 71   Ht 6\' 4"  (1.93 m)   Wt 234 lb (106.1 kg)   SpO2 92%   BMI 28.48 kg/m       ASSESSMENT:  Diabetes type 2, uncontrolled with Previous A1c 9.8%  See history of present illness for detailed discussion of current diabetes management, blood sugar patterns and problems identified His blood sugars are appearing to be relatively better at home with restarting Actos However he has gained weight which may be partly related to improved blood sugar control He can do better with diet also and needs more diabetes education  HYPERTENSION: Appears well-controlled  Mild hypokalemia: Not clear of the etiology, currently taking 10 mg once as a supplement for baseline level of 3.4. Needs follow-up  PLAN:     Keep him on 15 mg Actos and continue Amaryl Reminded him to bring his monitor for download on each visit Discussed blood sugar  targets Consultation with dietitian Encouraged him to start walking daily for exercise  Need to  check A1c and  chemistry panel today   Patient Instructions  Check blood sugars on waking up  2x weekly  Also check blood sugars about 2 hours after a meal and do this after different meals by rotation  Recommended blood sugar levels on waking up is 90-130 and about 2 hours after meal is 130-160  Please bring your blood sugar monitor to each visit, thank you  Reduce cookies and hi fat foods    Anayelli Lai 01/09/2016, 5:07 PM   Note: This office note was prepared with Dragon voice recognition system technology. Any transcriptional errors that result from this process are unintentional.

## 2016-01-09 NOTE — Patient Instructions (Signed)
Check blood sugars on waking up  2x weekly  Also check blood sugars about 2 hours after a meal and do this after different meals by rotation  Recommended blood sugar levels on waking up is 90-130 and about 2 hours after meal is 130-160  Please bring your blood sugar monitor to each visit, thank you  Reduce cookies and hi fat foods

## 2016-01-10 NOTE — Progress Notes (Signed)
Please let patient know that the potassium is lower.  Please have him discuss with PCP

## 2016-01-25 ENCOUNTER — Encounter: Payer: Medicare Other | Attending: Endocrinology | Admitting: Dietician

## 2016-01-25 ENCOUNTER — Encounter: Payer: Self-pay | Admitting: Dietician

## 2016-01-25 DIAGNOSIS — Z6828 Body mass index (BMI) 28.0-28.9, adult: Secondary | ICD-10-CM | POA: Insufficient documentation

## 2016-01-25 DIAGNOSIS — E119 Type 2 diabetes mellitus without complications: Secondary | ICD-10-CM | POA: Diagnosis not present

## 2016-01-25 DIAGNOSIS — I10 Essential (primary) hypertension: Secondary | ICD-10-CM | POA: Diagnosis not present

## 2016-01-25 DIAGNOSIS — J45909 Unspecified asthma, uncomplicated: Secondary | ICD-10-CM | POA: Insufficient documentation

## 2016-01-25 DIAGNOSIS — Z713 Dietary counseling and surveillance: Secondary | ICD-10-CM | POA: Diagnosis not present

## 2016-01-25 DIAGNOSIS — E1165 Type 2 diabetes mellitus with hyperglycemia: Secondary | ICD-10-CM

## 2016-01-25 NOTE — Progress Notes (Signed)
Diabetes Self-Management Education  Visit Type: First/Initial  Appt. Start Time: 1300 Appt. End Time: 1415  01/25/2016  Sean Stuart, identified by name and date of birth, is a 81 y.o. male with a diagnosis of Diabetes: Type 2. Other hx includes HTN and asthma.  Medications include Glimepiride and Actos.  Patient lives with a friend.  They mostly eat out although he will cook his breakfast.  His diet is very high in sugar with added sugar to coffee and sweetened cereal as well as increased intake of sugar sweetened beverages.  His diet is also high in fat and he snacks at night due to boredom when watching TV.  He is a retired Biomedical engineertruck driver and golf caddy.  ASSESSMENT  Height 6\' 4"  (1.93 m), weight 234 lb (106.1 kg). Body mass index is 28.48 kg/m.  He would like to lose weight.  His highest weight was 238 lbs.        Diabetes Self-Management Education - 01/25/16 1320      Visit Information   Visit Type First/Initial     Initial Visit   Diabetes Type Type 2   Are you currently following a meal plan? No   Are you taking your medications as prescribed? Yes   Date Diagnosed 1999     Health Coping   How would you rate your overall health? Excellent     Psychosocial Assessment   Patient Belief/Attitude about Diabetes Motivated to manage diabetes   Self-care barriers None   Self-management support Doctor's office;Family;Friends   Other persons present Patient   Patient Concerns Nutrition/Meal planning;Weight Control   Special Needs None   Preferred Learning Style No preference indicated   Learning Readiness Ready   How often do you need to have someone help you when you read instructions, pamphlets, or other written materials from your doctor or pharmacy? 1 - Never   What is the last grade level you completed in school? GED     Pre-Education Assessment   Patient understands the diabetes disease and treatment process. Needs Review   Patient understands incorporating  nutritional management into lifestyle. Needs Instruction   Patient undertands incorporating physical activity into lifestyle. Needs Review   Patient understands using medications safely. Needs Review   Patient understands monitoring blood glucose, interpreting and using results Needs Review   Patient understands prevention, detection, and treatment of acute complications. Needs Review   Patient understands prevention, detection, and treatment of chronic complications. Needs Review   Patient understands how to develop strategies to address psychosocial issues. Demonstrates understanding / competency   Patient understands how to develop strategies to promote health/change behavior. Needs Instruction     Complications   Last HgB A1C per patient/outside source 8 %  01/09/16 decreased from 9.8% 10/18/15   How often do you check your blood sugar? 1-2 times/day   Fasting Blood glucose range (mg/dL) 562-130;[id number];70-129  [phone]   Postprandial Blood glucose range (mg/dL) 528-413130-179  about 244150   Number of hypoglycemic episodes per month 0   Number of hyperglycemic episodes per week 0   Have you had a dilated eye exam in the past 12 months? Yes   Have you had a dental exam in the past 12 months? Yes   Are you checking your feet? Yes   How many days per week are you checking your feet? 7     Dietary Intake   Breakfast boiled eggs with occasional bacon, toast with honey OR [place] sausage biscuit  9-10  Snack (morning) NABS occasionally   Lunch seven layer salad OR [employer] cheeseburger and fries  12-1   Snack (afternoon) candy bar    Dinner Grilled cheese and french fries OR greens, fried cornbread, fat back   Snack (evening) cookies, potatoe chips "bored"   Beverage(s) water, regular soda (pepsi or gingerale), coffee with creamer and sugar "too much >/=3 heaping tsp", sweet tea, OJ occasionally     Exercise   Exercise Type Light (walking / raking leaves)  walking, bowling, golf   How many days per  week to you exercise? 2   How many minutes per day do you exercise? 20   Total minutes per week of exercise 40     Patient Education   Previous Diabetes Education Yes (please comment)  classes when first diagnosed   Disease state  Definition of diabetes, type 1 and 2, and the diagnosis of diabetes   Nutrition management  Role of diet in the treatment of diabetes and the relationship between the three main macronutrients and blood glucose level;Food label reading, portion sizes and measuring food.;Information on hints to eating out and maintain blood glucose control.;Meal options for control of blood glucose level and chronic complications.   Physical activity and exercise  Role of exercise on diabetes management, blood pressure control and cardiac health.   Medications Other (comment)  discussed medication timing and importance of taking daily.   Monitoring Identified appropriate SMBG and/or A1C goals.;Yearly dilated eye exam;Daily foot exams   Acute complications Taught treatment of hypoglycemia - the 15 rule.   Chronic complications Relationship between chronic complications and blood glucose control   Psychosocial adjustment Role of stress on diabetes   Personal strategies to promote health Lifestyle issues that need to be addressed for better diabetes care     Individualized Goals (developed by patient)   Nutrition General guidelines for healthy choices and portions discussed   Physical Activity Exercise 5-7 days per week;30 minutes per day   Medications take my medication as prescribed   Monitoring  test my blood glucose as discussed   Reducing Risk Other (comment)  decrease beverages with added sugar and decrease added sugar     Post-Education Assessment   Patient understands the diabetes disease and treatment process. Demonstrates understanding / competency   Patient understands incorporating nutritional management into lifestyle. Demonstrates understanding / competency   Patient  undertands incorporating physical activity into lifestyle. Demonstrates understanding / competency   Patient understands using medications safely. Demonstrates understanding / competency   Patient understands monitoring blood glucose, interpreting and using results Demonstrates understanding / competency   Patient understands prevention, detection, and treatment of acute complications. Demonstrates understanding / competency   Patient understands prevention, detection, and treatment of chronic complications. Demonstrates understanding / competency   Patient understands how to develop strategies to address psychosocial issues. Demonstrates understanding / competency   Patient understands how to develop strategies to promote health/change behavior. Demonstrates understanding / competency     Outcomes   Expected Outcomes Demonstrated interest in learning. Expect positive outcomes   Future DMSE 3-4 months   Program Status Completed      Individualized Plan for Diabetes Self-Management Training:   Learning Objective:  Patient will have a greater understanding of diabetes self-management. Patient education plan is to attend individual and/or group sessions per assessed needs and concerns.   Plan:   Patient Instructions  Avoid added sugar. Rethink what you drink.  Water is great!  Avoid adding sugar to coffee.  Avoid  regular soda, sweet tea. Choose a protein bar rather than a candy bar.  (It should have only about 15 grams carbohydrates.) When you eat out, look for things on the menu that are baked or broiled rather than fried.  Ask for smaller amounts of sauces and consider getting them on the side.  Be as active as possible.  Aim for some form of exercise most days of the week for at least 30 minutes.   Plan:  Aim for 4 Carb Choices per meal (60 grams) +/- 1 either way  Aim for 0-1 Carbs per snack if hungry  Include protein in moderation with your meals and snacks Consider reading food  labels for Total Carbohydrate and Fat Grams of foods     Expected Outcomes:  Demonstrated interest in learning. Expect positive outcomes  Education material provided: Living Well with Diabetes, Food label handouts, A1C conversion sheet, Meal plan card, My Plate and Snack sheet, Dining Out with diabetes, Making healthy fast food choices  If problems or questions, patient to contact team via:  Phone and Email  Future DSME appointment: 3-4 months

## 2016-01-25 NOTE — Patient Instructions (Addendum)
Avoid added sugar. Rethink what you drink.  Water is great!  Avoid adding sugar to coffee.  Avoid regular soda, sweet tea. Choose a protein bar rather than a candy bar.  (It should have only about 15 grams carbohydrates.) When you eat out, look for things on the menu that are baked or broiled rather than fried.  Ask for smaller amounts of sauces and consider getting them on the side.  Be as active as possible.  Aim for some form of exercise most days of the week for at least 30 minutes.   Plan:  Aim for 4 Carb Choices per meal (60 grams) +/- 1 either way  Aim for 0-1 Carbs per snack if hungry  Include protein in moderation with your meals and snacks Consider reading food labels for Total Carbohydrate and Fat Grams of foods

## 2016-02-08 ENCOUNTER — Other Ambulatory Visit: Payer: Self-pay | Admitting: Endocrinology

## 2016-02-11 ENCOUNTER — Other Ambulatory Visit: Payer: Self-pay

## 2016-02-11 MED ORDER — GLIMEPIRIDE 4 MG PO TABS: ORAL_TABLET | ORAL | 2 refills | Status: DC

## 2016-02-11 MED ORDER — PIOGLITAZONE HCL 15 MG PO TABS: ORAL_TABLET | ORAL | 2 refills | Status: DC

## 2016-03-17 ENCOUNTER — Other Ambulatory Visit: Payer: Self-pay | Admitting: Endocrinology

## 2016-04-10 ENCOUNTER — Encounter: Payer: Self-pay | Admitting: Endocrinology

## 2016-04-10 ENCOUNTER — Encounter: Payer: Medicare Other | Attending: Endocrinology | Admitting: Dietician

## 2016-04-10 ENCOUNTER — Ambulatory Visit (INDEPENDENT_AMBULATORY_CARE_PROVIDER_SITE_OTHER): Payer: Medicare Other | Admitting: Endocrinology

## 2016-04-10 ENCOUNTER — Encounter: Payer: Self-pay | Admitting: Dietician

## 2016-04-10 VITALS — BP 130/84 | HR 67 | Ht 76.0 in | Wt 235.0 lb

## 2016-04-10 DIAGNOSIS — E119 Type 2 diabetes mellitus without complications: Secondary | ICD-10-CM | POA: Diagnosis not present

## 2016-04-10 DIAGNOSIS — Z713 Dietary counseling and surveillance: Secondary | ICD-10-CM | POA: Insufficient documentation

## 2016-04-10 DIAGNOSIS — I10 Essential (primary) hypertension: Secondary | ICD-10-CM

## 2016-04-10 DIAGNOSIS — E1165 Type 2 diabetes mellitus with hyperglycemia: Secondary | ICD-10-CM | POA: Diagnosis not present

## 2016-04-10 DIAGNOSIS — J45909 Unspecified asthma, uncomplicated: Secondary | ICD-10-CM | POA: Diagnosis not present

## 2016-04-10 DIAGNOSIS — Z6828 Body mass index (BMI) 28.0-28.9, adult: Secondary | ICD-10-CM | POA: Diagnosis not present

## 2016-04-10 LAB — BASIC METABOLIC PANEL
BUN: 11 mg/dL (ref 6–23)
CALCIUM: 8.9 mg/dL (ref 8.4–10.5)
CO2: 29 meq/L (ref 19–32)
CREATININE: 1 mg/dL (ref 0.40–1.50)
Chloride: 104 mEq/L (ref 96–112)
GFR: 76.58 mL/min (ref >60.00)
Glucose, Bld: 135 mg/dL — ABNORMAL HIGH (ref 70–99)
Potassium: 3.7 mEq/L (ref 3.5–5.1)
Sodium: 137 mEq/L (ref 135–145)

## 2016-04-10 LAB — LIPID PANEL
Cholesterol: 164 mg/dL (ref 0–200)
HDL: 59.3 mg/dL (ref >39.00)
LDL CALC: 97 mg/dL (ref 0–99)
NONHDL: 104.43
Total CHOL/HDL Ratio: 3
Triglycerides: 39 mg/dL (ref 0.0–149.0)
VLDL: 7.8 mg/dL (ref 0.0–40.0)

## 2016-04-10 LAB — GLUCOSE, POCT (MANUAL RESULT ENTRY): POC GLUCOSE: 128 mg/dL — AB (ref 70–99)

## 2016-04-10 LAB — POCT GLYCOSYLATED HEMOGLOBIN (HGB A1C): Hemoglobin A1C: 7.1

## 2016-04-10 MED ORDER — GLUCOSE BLOOD VI STRP: ORAL_STRIP | 2 refills | Status: AC

## 2016-04-10 NOTE — Patient Instructions (Signed)
Patient Instructions  Avoid added sugar. Continue to Rethink what you drink.  Water is great!  Avoid adding sugar to coffee.  Avoid regular soda, sweet tea. Choose a protein bar rather than a candy bar.  (It should have only about 15 grams carbohydrates.) When you eat out, look for things on the menu that are baked or broiled rather than fried.  Ask for smaller amounts of sauces and consider getting them on the side.  Eat more vegetables.  Simple meals at home more often. Be more active.  Try to walk or do exercise at the senior center most days for 30 minutes.  Aim for 4 Carb Choices per meal (60 grams) +/- 1 either way  Aim for 0-1 Carbs per snack if hungry  Include protein in moderation with your meals and snacks Consider reading food labels for Total Carbohydrate and Fat Grams of foods

## 2016-04-10 NOTE — Progress Notes (Signed)
Patient ID: Sean Stuart, male   DOB: 10-31-1934, 82 y.o.   MRN: [id number]           Reason for Appointment: Follow-up for Type 2 Diabetes    History of Present Illness:          Date of diagnosis of type 2 diabetes mellitus: 1999        Background history:   He was diagnosed to have diabetes and treated initially with Amaryl Subsequently Actos was added and has been on the same regimen since then.  Initially had good control with this regimen but not clear what his subsequent level of control has been He does not think he has been on metformin previously He was started back on Actos on his initial consultation in 10/2015 when his A1c was 9.8  Recent history:     Non-insulin hypoglycemic drugs the patient is taking are: Amaryl 4 mg daily, Actos 15 mg daily  His A1c is significantly better at 7.1, previously 8  Current management, blood sugar patterns and problems identified:  His Generic monitor was reading falsely high today at 309  Although he thinks his blood sugars are fairly good at home not clear how often he is monitoring, he says he has 3 different meters at home but has not started using the One Touch meter that was given  He has not gain anymore weight since his last visit  His activity level has been somewhat less this winter  However he is still trying to do reasonably well on his diet with going to [employer] a and not fast food places  However sometimes will have more high-fat snacks       Side effects from medications have been: None  Compliance with the medical regimen: Fair Hypoglycemia: None    Glucose monitoring:  done ?  Once a day        Glucometer: ?  One Touch   readings not available   Self-care: The diet that the patient has been following is: tries to limit drinks with sugar .      Eating out once or twice a week, less at fast food restaurant, sometimes at the cafeteria Snacks on cookies, chips           Dietician visit, most recent:  2007               Exercise: some    Weight history:  Wt Readings from Last 3 Encounters:  04/10/16 235 lb (106.6 kg)  04/10/16 235 lb (106.6 kg)  01/25/16 234 lb (106.1 kg)    Glycemic control:   Lab Results  Component Value Date   HGBA1C 7.1 04/10/2016   HGBA1C 8.0 (H) 01/09/2016   HGBA1C 9.8 10/18/2015   Lab Results  Component Value Date   MICROALBUR 11.5 (H) 10/18/2015   LDLCALC 90 11/21/2015   CREATININE 1.05 01/09/2016   Lab Results  Component Value Date   MICRALBCREAT 5.6 10/18/2015       Allergies as of 04/10/2016   No Known Allergies     Medication List       Accurate as of 04/10/16  9:53 AM. Always use your most recent med list.          amLODipine-atorvastatin 5-40 MG tablet Commonly known as:  CADUET Take 1 tablet by mouth daily.   aspirin EC 81 MG tablet Take 81 mg by mouth every morning.   bimatoprost 0.01 % Soln Commonly known as:  LUMIGAN Place 1  drop into both eyes every morning.   glimepiride 4 MG tablet Commonly known as:  AMARYL TAKE ONE TABLET BY MOUTH ONCE DAILY BEFORE  BREAKFAST   glucose blood test strip Commonly known as:  ONETOUCH VERIO Use to check blood sugar 2 times per day.   pioglitazone 15 MG tablet Commonly known as:  ACTOS TAKE ONE TABLET BY MOUTH ONCE DAILY IN THE MORNING   potassium chloride 10 MEQ tablet Commonly known as:  K-DUR TAKE ONE TABLET BY MOUTH ONCE DAILY       Allergies: No Known Allergies  Past Medical History:  Diagnosis Date  . Arthritis    knees  . Asthma   . Diabetes mellitus   . Hypertension   . Sleep apnea    STOP BANG score 5    Past Surgical History:  Procedure Laterality Date  . CATARACT EXTRACTION W/PHACO  06/19/2011   Procedure: CATARACT EXTRACTION PHACO AND INTRAOCULAR LENS PLACEMENT (IOC);  Surgeon: Gemma PayorKerry Hunt, MD;  Location: [facility];  Service: Ophthalmology;  Laterality: Left;  CDE: 12.83  . CATARACT EXTRACTION W/PHACO  07/07/2011   Procedure: CATARACT EXTRACTION  PHACO AND INTRAOCULAR LENS PLACEMENT (IOC);  Surgeon: Gemma PayorKerry Hunt, MD;  Location: [facility];  Service: Ophthalmology;  Laterality: Right;  CDE 12.82  . LIPOMA EXCISION    . right leg surgery     from [facility]    Family History  Problem Relation Age of Onset  . Diabetes Mother   . CAD Father   . Anesthesia problems Neg Hx   . Hypotension Neg Hx   . Malignant hyperthermia Neg Hx   . Pseudochol deficiency Neg Hx     Social History:  reports that he has quit smoking. He has never used smokeless tobacco. He reports that he does not drink alcohol or use drugs.   Review of Systems  Mild hypokalemia: This has been persistent and not on diuretics Not clear if this has been evaluated by PCP Blood pressure has been relatively easy to control Still taking his potassium supplement  Lipid history: Taking Lipitor in the combination tablet.      Lab Results  Component Value Date   CHOL 152 11/21/2015   HDL 52.10 11/21/2015   LDLCALC 90 11/21/2015   TRIG 51.0 11/21/2015   CHOLHDL 3 11/21/2015           Hypertension: since 1999  He is taking amlodipine in his [place], Previously on been benazepril and not clear if he is still taking this  Most recent eye exam was 9/17   Most recent foot exam: 8/17   Physical Examination:  BP 130/84   Pulse 67   Ht 6\' 4"  (1.93 m)   Wt 235 lb (106.6 kg)   SpO2 94%   BMI 28.61 kg/m    No ankle edema present     ASSESSMENT:  Diabetes type 2, uncontrolled with Previous A1c 9.8%  His A1c is now down to 7.1%  See history of present illness for description  of current diabetes management, blood sugar patterns and problems identified He has continued doing better with his control using Actos low dose No hypoglycemia reported with Amaryl Weight has leveled off He is generally watching his diet although can be better He tries to be as active as possible  His blood sugars are appearing to be relatively better at home with restarting Actos However he has  gained weight which may be partly related to improved blood sugar control He can do better  with diet also and needs more diabetes education  HYPERTENSION:  followed by PCP, fairly well controlled   Mild hypokalemia: Not clear of the etiology, currently taking potassium supplement  Needs follow-up  PLAN:    He will continue 15 mg Actos and Amaryl Start using One Touch meter, he will call to verify the brand he is using He will check some readings after meals also Less high fat snacks  To have lipids and chemistry panel checked today   Preventive care: He will check to see if he needs Prevnar with his PCP   Patient Instructions  Check blood sugars on waking up    Also check blood sugars about 2 hours after a meal and do this after different meals by rotation  Recommended blood sugar levels on waking up is 90-130 and about 2 hours after meal is 130-160  Please bring your blood sugar monitor to each visit, thank you      [facility] 04/10/2016, 9:53 AM   Note: This office note was prepared with Dragon voice recognition system technology. Any transcriptional errors that result from this process are unintentional.

## 2016-04-10 NOTE — Patient Instructions (Addendum)
Check blood sugars on waking up    Also check blood sugars about 2 hours after a meal and do this after different meals by rotation  Recommended blood sugar levels on waking up is 90-130 and about 2 hours after meal is 130-160  Please bring your blood sugar monitor to each visit, thank you  

## 2016-04-11 NOTE — Progress Notes (Signed)
Diabetes Self-Management Education  Visit Type: Follow-up  Appt. Start Time: 0900 Appt. End Time: 0915 Patient was late due to MD appointment.  04/11/2016  [name], identified by name and date of birth, is a 82 y.o. male with a diagnosis of Diabetes: Type 2. Other hx includes HTN and asthma.  Medications include Glimepiride and Actos.  Patient lives with a friend.  They mostly eat out although he will cook his breakfast.  His diet is very high in sugar with added sugar to coffee and sweetened cereal as well as increased intake of sugar sweetened beverages.  His diet is also high in fat and he snacks at night due to boredom when watching TV.  He is a retired Biomedical engineertruck driver and golf caddy.  Patient comes in today alone after his appointment with Dr. Lucianne MussKumar.  His A1C has decreased from 8.0% 01/09/16 to 7.1% today.  His weight is stable.  He has changed the candy bar to a protein bar and has decreased his sugar sweetened beverages although is consuming less.  He continues to take Glimepiride and Actos.  ASSESSMENT  Height 6' 3.5" (1.918 m), weight 235 lb (106.6 kg). Body mass index is 28.99 kg/m.      Diabetes Self-Management Education - 04/10/16 0901      Visit Information   Visit Type Follow-up     Initial Visit   Diabetes Type Type 2   Are you currently following a meal plan? Yes     Complications   How often do you check your blood sugar? 1-2 times/day   Fasting Blood glucose range (mg/dL) 16-10970-129   Postprandial Blood glucose range (mg/dL) 60-45470-129   Are you checking your feet? Yes   How many days per week are you checking your feet? 7     Dietary Intake   Breakfast honey nut cheerios, 2% milk    Snack (morning) NABS or chips   Lunch hamburger   Snack (afternoon) protein bar   Dinner [employer] beans, fried chicken wing, sweet tea with Pepsi   Snack (evening) grapes and 1/2 slice sweet potato pie   Beverage(s) water, unsweetened tea with sweet and low, regular soda  but a little less, coffee with 3 heaping tsp but less often     Exercise   Exercise Type Light (walking / raking leaves)   How many days per week to you exercise? 1   How many minutes per day do you exercise? 1   Total minutes per week of exercise 1     Subsequent Visit   Since your last visit have you continued or begun to take your medications as prescribed? Yes   Since your last visit have you had your blood pressure checked? Yes   Is your most recent blood pressure lower, unchanged, or higher since your last visit? Unchanged   Since your last visit have you experienced any weight changes? No change   Since your last visit, are you checking your blood glucose at least once a day? Yes      Individualized Plan for Diabetes Self-Management Training:   Learning Objective:  Patient will have a greater understanding of diabetes self-management. Patient education plan is to attend individual and/or group sessions per assessed needs and concerns.   Plan:   Patient Instructions   Patient Instructions  Avoid added sugar. Continue to Rethink what you drink.  Water is great!  Avoid adding sugar to coffee.  Avoid regular soda, sweet tea. Choose a protein bar  rather than a candy bar.  (It should have only about 15 grams carbohydrates.) When you eat out, look for things on the menu that are baked or broiled rather than fried.  Ask for smaller amounts of sauces and consider getting them on the side.  Eat more vegetables.  Simple meals at home more often. Be more active.  Try to walk or do exercise at the senior center most days for 30 minutes.  Aim for 4 Carb Choices per meal (60 grams) +/- 1 either way  Aim for 0-1 Carbs per snack if hungry  Include protein in moderation with your meals and snacks Consider reading food labels for Total Carbohydrate and Fat Grams of foods    Expected Outcomes:    Patient demonstrated interest in learning.  Expect continued work towards  goals.  Education material provided: none on this visit.  If problems or questions, patient to contact team via:  Phone  Future DSME appointment:  3 months when he returns to see Dr. Lucianne Muss.

## 2016-06-06 ENCOUNTER — Other Ambulatory Visit: Payer: Self-pay | Admitting: Endocrinology

## 2016-06-10 ENCOUNTER — Other Ambulatory Visit: Payer: Self-pay | Admitting: Endocrinology

## 2016-07-09 ENCOUNTER — Ambulatory Visit: Payer: Medicare Other | Admitting: Endocrinology

## 2016-07-10 ENCOUNTER — Ambulatory Visit (INDEPENDENT_AMBULATORY_CARE_PROVIDER_SITE_OTHER): Payer: Medicare Other | Admitting: Endocrinology

## 2016-07-10 ENCOUNTER — Encounter: Payer: Self-pay | Admitting: Dietician

## 2016-07-10 ENCOUNTER — Encounter: Payer: Medicare Other | Attending: Endocrinology | Admitting: Dietician

## 2016-07-10 ENCOUNTER — Ambulatory Visit: Payer: Medicare Other | Admitting: Endocrinology

## 2016-07-10 ENCOUNTER — Encounter: Payer: Medicare Other | Admitting: Dietician

## 2016-07-10 VITALS — BP 140/72 | HR 59 | Temp 97.7°F | Wt 242.0 lb

## 2016-07-10 DIAGNOSIS — I10 Essential (primary) hypertension: Secondary | ICD-10-CM | POA: Insufficient documentation

## 2016-07-10 DIAGNOSIS — J45909 Unspecified asthma, uncomplicated: Secondary | ICD-10-CM | POA: Insufficient documentation

## 2016-07-10 DIAGNOSIS — Z713 Dietary counseling and surveillance: Secondary | ICD-10-CM | POA: Insufficient documentation

## 2016-07-10 DIAGNOSIS — E1165 Type 2 diabetes mellitus with hyperglycemia: Secondary | ICD-10-CM

## 2016-07-10 DIAGNOSIS — E119 Type 2 diabetes mellitus without complications: Secondary | ICD-10-CM | POA: Diagnosis not present

## 2016-07-10 LAB — POCT GLYCOSYLATED HEMOGLOBIN (HGB A1C): Hemoglobin A1C: 7

## 2016-07-10 NOTE — Progress Notes (Signed)
Patient ID: Sean Stuart, male   DOB: 16-Nov-1934, 82 y.o.   MRN: [id number]           Reason for Appointment: Follow-up for Type 2 Diabetes    History of Present Illness:          Date of diagnosis of type 2 diabetes mellitus: 1999        Background history:   He was diagnosed to have diabetes and treated initially with Amaryl Subsequently Actos was added and has been on the same regimen since then.  Initially had good control with this regimen but not clear what his subsequent level of control has been He does not think he has been on metformin previously He was started back on Actos on his initial consultation in 10/2015 when his A1c was 9.8  Recent history:     Non-insulin hypoglycemic drugs the patient is taking are: Amaryl 4 mg daily, Actos 15 mg daily  His A1c is consistently fairly good at 7%, previously 7.1  Current management, blood sugar patterns and problems identified:  He did not bring his glucose monitor today, was given a One Touch Verio on the last visit  He says he has been eating out a lot and traveling which has caused him to gain some weight  He still not consistent with his diet with not avoiding drinks with sugar, fried food and snacks  Usually very compliant with taking his medications       Side effects from medications have been: None  Compliance with the medical regimen: Fair Hypoglycemia: None    Glucose monitoring:  done ?  Once a day        Glucometer: ?  One Touch  Blood sugars by recall:    Mean values apply above for all meters except median for One Touch  PRE-MEAL Fasting Lunch Dinner Bedtime Overall  Glucose range: 110-190    <150   Mean/median: 135        Self-care: The diet that the patient has been following ZD:GUYQ  Eating out once or twice a week, less at fast food restaurant, sometimes at the cafeteria, Not always avoiding sweet tea Snacks on cookies, chips           Dietician visit, most recent: 2007                 Exercise: walking 60 min on most days recently  Weight history:  Wt Readings from Last 3 Encounters:  07/10/16 242 lb (109.8 kg)  04/10/16 235 lb (106.6 kg)  04/10/16 235 lb (106.6 kg)    Glycemic control:   Lab Results  Component Value Date   HGBA1C 7.0 07/10/2016   HGBA1C 7.1 04/10/2016   HGBA1C 8.0 (H) 01/09/2016   Lab Results  Component Value Date   MICROALBUR 11.5 (H) 10/18/2015   LDLCALC 97 04/10/2016   CREATININE 1.00 04/10/2016   Lab Results  Component Value Date   MICRALBCREAT 5.6 10/18/2015       Allergies as of 07/10/2016   No Known Allergies     Medication List       Accurate as of 07/10/16 10:29 AM. Always use your most recent med list.          amLODipine-atorvastatin 5-40 MG tablet Commonly known as:  CADUET Take 1 tablet by mouth daily.   aspirin EC 81 MG tablet Take 81 mg by mouth every morning.   bimatoprost 0.01 % Soln Commonly known as:  LUMIGAN Place 1  drop into both eyes every morning.   glimepiride 4 MG tablet Commonly known as:  AMARYL TAKE ONE TABLET BY MOUTH ONCE DAILY BEFORE  BREAKFAST   glucose blood test strip Commonly known as:  ONETOUCH VERIO Use to check blood sugar 1 times per day.   pioglitazone 15 MG tablet Commonly known as:  ACTOS TAKE ONE TABLET BY MOUTH ONCE DAILY IN THE MORNING   potassium chloride 10 MEQ tablet Commonly known as:  K-DUR TAKE ONE TABLET BY MOUTH ONCE DAILY       Allergies: No Known Allergies  Past Medical History:  Diagnosis Date  . Arthritis    knees  . Asthma   . Diabetes mellitus   . Hypertension   . Sleep apnea    STOP BANG score 5    Past Surgical History:  Procedure Laterality Date  . CATARACT EXTRACTION W/PHACO  06/19/2011   Procedure: CATARACT EXTRACTION PHACO AND INTRAOCULAR LENS PLACEMENT (IOC);  Surgeon: Gemma Payor, MD;  Location: [facility];  Service: Ophthalmology;  Laterality: Left;  CDE: 12.83  . CATARACT EXTRACTION W/PHACO  07/07/2011   Procedure: CATARACT  EXTRACTION PHACO AND INTRAOCULAR LENS PLACEMENT (IOC);  Surgeon: Gemma Payor, MD;  Location: [facility];  Service: Ophthalmology;  Laterality: Right;  CDE 12.82  . LIPOMA EXCISION    . right leg surgery     from [facility]    Family History  Problem Relation Age of Onset  . Diabetes Mother   . CAD Father   . Anesthesia problems Neg Hx   . Hypotension Neg Hx   . Malignant hyperthermia Neg Hx   . Pseudochol deficiency Neg Hx     Social History:  reports that he has quit smoking. He has never used smokeless tobacco. He reports that he does not drink alcohol or use drugs.   Review of Systems  Mild hypokalemia: This has been persistent and not on diuretics Had labs done by PCP Still taking his potassium supplement  Lipid history: Taking Lipitor in the combination tablet With amlodipine.      Lab Results  Component Value Date   CHOL 164 04/10/2016   HDL 59.30 04/10/2016   LDLCALC 97 04/10/2016   TRIG 39.0 04/10/2016   CHOLHDL 3 04/10/2016           Hypertension: since 1999  He is taking amlodipine   BP Readings from Last 3 Encounters:  07/10/16 140/72  04/10/16 130/84  01/09/16 132/78     Most recent eye exam was 9/17   Most recent foot exam: 8/17   Physical Examination:  BP 140/72   Pulse (!) 59   Temp 97.7 F (36.5 C) (Oral)   Wt 242 lb (109.8 kg)   SpO2 96%   BMI 29.85 kg/m    No ankle edema present     ASSESSMENT:  Diabetes type 2    See history of present illness for description  of current diabetes management, blood sugar patterns and problems identified  His A1c is now down to 7% and appears to be consistent  This is despite his recently going off his diet, gaining weight and not being consistent with watching calories and drinks with sugar Currently on a regimen of Actos and Amaryl, has previously done very well with adding Actos  HYPERTENSION:  followed by PCP, fairly well controlled    PLAN:   He will see the dietitian Advised him to cut back  on high-fat foods and eliminating drinks with sugar He will continue  15 mg Actos and Amaryl Discussed timing of glucose monitoring and targets Blood sugars may improve further with better diet and weight loss and will not adjust his regimen as yet  Most likely will need Prevnar as no information is available from PCP whether he has had his vaccinations To get lab reports from PCP office   Patient Instructions  Check blood sugars on waking up  2-3x weekly  Also check blood sugars about 2 hours after a meal and do this after different meals by rotation  Recommended blood sugar levels on waking up is 90-130 and about 2 hours after meal is 130-160  Please bring your blood sugar monitor to each visit, thank you      [facility] 07/10/2016, 10:29 AM   Note: This office note was prepared with Dragon voice recognition system technology. Any transcriptional errors that result from this process are unintentional.

## 2016-07-10 NOTE — Progress Notes (Signed)
Pre visit review using our clinic review tool, if applicable. No additional management support is needed unless otherwise documented below in the visit note. 

## 2016-07-10 NOTE — Patient Instructions (Signed)
Stay active most days. (Golf, walking, etc.) Stop drinking soda and drink more water. Change the cookie snack to a lower carbohydrate [facility] [employer] or other snack with only 15-20 grams carbohydrate). Choose baked, broiled, or grilled rather than fried. Avoid adding extra butter, sour cream, mayo or other fats.

## 2016-07-10 NOTE — Patient Instructions (Addendum)
Check blood sugars on waking up  2-3x weekly  Also check blood sugars about 2 hours after a meal and do this after different meals by rotation  Recommended blood sugar levels on waking up is 90-130 and about 2 hours after meal is 130-160  Please bring your blood sugar monitor to each visit, thank you  

## 2016-07-10 NOTE — Progress Notes (Signed)
Diabetes Self-Management Education  Visit Type:  Follow-up  Appt. Start Time: 1410 Appt. End Time: 1450 Patient arrived late to his appointment.  07/10/2016  [name], identified by name and date of birth, is a 82 y.o. male with a diagnosis of Diabetes:  type 2.   Other hx includes HTN and asthma. Medications include Glimepiride and Actos.  Patient lives with a friend. They mostly eat out although he will cook his breakfast. His diet is very high in sugar with added sugar to coffee and sweetened cereal as well as increased intake of sugar sweetened beverages. His diet is also high in fat and he snacks at night due to boredom when watching TV. He is a retired Biomedical engineer caddy.  Patient is here today alone.  He has resumed some sugar soda and sweets such as Little Debbie snacks.  He will skip meals at times.  States that he needs to start walking again.  A1C 7% stable from last visit but has gained 7 lbs in the past 3 months. States that he is willing to change but finds it hard.  Patient eats out most often.  ASSESSMENT  Weight 242 lb (109.8 kg). Body mass index is 29.85 kg/m.       Diabetes Self-Management Education - 07/10/16 1417      Dietary Intake   Breakfast 2 boiled eggs OR honey nut cheerios with 2% milk, coffee with 2 spoons sugar  9:30-10   Snack (morning) occasional NABS   Lunch fish sandwhich, fries, water, Little Debbie  OR skips   Snack (afternoon) Little Debbie    Dinner clam chowder, broccoli and cheese, greens, pintos   Snack (evening) chips, cookies   Beverage(s) water, coffee with 2 spoons sugar or splenda, 1 regular pepsi a day,      Subsequent Visit   Since your last visit have you continued or begun to take your medications as prescribed? Yes   Since your last visit have you had your blood pressure checked? Yes   Is your most recent blood pressure lower, unchanged, or higher since your last visit? Higher   Since your last visit  have you experienced any weight changes? Gain   Weight Gain (lbs) 7   Since your last visit, are you checking your blood glucose at least once a day? Yes      Learning Objective:  Patient will have a greater understanding of diabetes self-management. Patient education plan is to attend individual and/or group sessions per assessed needs and concerns. Spent visit today discussing healthier choices when eating out, behavioral change, and label reading.  Plan:   Patient Instructions  Stay active most days. (Golf, walking, etc.) Stop drinking soda and drink more water. Change the cookie snack to a lower carbohydrate [facility] [employer] or other snack with only 15-20 grams carbohydrate). Choose baked, broiled, or grilled rather than fried. Avoid adding extra butter, sour cream, mayo or other fats.    Expected Outcomes:   Patient with interest in learning but finds change difficult.  Education material provided: Food label handouts, My Plate and Snack sheet  If problems or questions, patient to contact team via:  Phone  Future DSME appointment: -  3 months after Dr. Lucianne Muss appointment.

## 2016-08-21 ENCOUNTER — Other Ambulatory Visit: Payer: Self-pay | Admitting: Endocrinology

## 2016-09-16 ENCOUNTER — Other Ambulatory Visit: Payer: Self-pay | Admitting: Endocrinology

## 2016-09-19 ENCOUNTER — Other Ambulatory Visit: Payer: Self-pay | Admitting: Endocrinology

## 2016-10-09 ENCOUNTER — Ambulatory Visit: Payer: Medicare Other | Admitting: Endocrinology

## 2016-10-09 ENCOUNTER — Ambulatory Visit: Payer: Medicare Other | Admitting: Dietician

## 2016-10-09 DIAGNOSIS — Z0289 Encounter for other administrative examinations: Secondary | ICD-10-CM

## 2016-10-31 ENCOUNTER — Other Ambulatory Visit: Payer: Self-pay | Admitting: Endocrinology

## 2016-10-31 NOTE — Telephone Encounter (Signed)
Patient is out of potassium chloride and wants to know if it can be called in to [facility] today.

## 2016-10-31 NOTE — Telephone Encounter (Signed)
Please have him scheduled follow-up appointment, 10 given 15 tablets with no refill

## 2016-11-28 ENCOUNTER — Other Ambulatory Visit: Payer: Self-pay | Admitting: Endocrinology

## 2016-12-06 ENCOUNTER — Other Ambulatory Visit: Payer: Self-pay | Admitting: Endocrinology

## 2017-01-26 ENCOUNTER — Other Ambulatory Visit: Payer: Self-pay | Admitting: Endocrinology

## 2017-04-14 ENCOUNTER — Other Ambulatory Visit: Payer: Self-pay

## 2017-04-14 MED ORDER — PIOGLITAZONE HCL 15 MG PO TABS: ORAL_TABLET | ORAL | 2 refills | Status: AC

## 2018-03-06 ENCOUNTER — Other Ambulatory Visit: Payer: Self-pay | Admitting: Endocrinology

## 2018-03-12 ENCOUNTER — Other Ambulatory Visit: Payer: Self-pay | Admitting: Endocrinology

## 2018-03-16 ENCOUNTER — Other Ambulatory Visit: Payer: Self-pay | Admitting: Endocrinology

## 2018-07-30 ENCOUNTER — Telehealth: Payer: Self-pay | Admitting: Endocrinology

## 2018-07-30 NOTE — Telephone Encounter (Signed)
Pt was called and left a detailed voicemail informing him that before receiving refills, the pt must be seen because he has not been since for an appointment in over 2 years.

## 2018-07-30 NOTE — Telephone Encounter (Signed)
Per [facility], "Caller states he needs his scripts faxed to [facility] in [place]. He has been out of his meds for two days. Is out of pioglitazone 15 mg po daily, has appt tomorrow afternoon and has not asked pharmacy for loaner dose."  In doctors box

## 2019-03-22 DIAGNOSIS — E118 Type 2 diabetes mellitus with unspecified complications: Secondary | ICD-10-CM | POA: Diagnosis not present

## 2019-03-22 DIAGNOSIS — J069 Acute upper respiratory infection, unspecified: Secondary | ICD-10-CM | POA: Diagnosis not present

## 2019-03-22 DIAGNOSIS — E119 Type 2 diabetes mellitus without complications: Secondary | ICD-10-CM | POA: Diagnosis not present

## 2019-03-22 DIAGNOSIS — E1165 Type 2 diabetes mellitus with hyperglycemia: Secondary | ICD-10-CM | POA: Diagnosis not present

## 2019-03-22 DIAGNOSIS — E1169 Type 2 diabetes mellitus with other specified complication: Secondary | ICD-10-CM | POA: Diagnosis not present

## 2019-03-22 DIAGNOSIS — I10 Essential (primary) hypertension: Secondary | ICD-10-CM | POA: Diagnosis not present

## 2019-03-22 DIAGNOSIS — Z136 Encounter for screening for cardiovascular disorders: Secondary | ICD-10-CM | POA: Diagnosis not present

## 2019-03-22 DIAGNOSIS — Z125 Encounter for screening for malignant neoplasm of prostate: Secondary | ICD-10-CM | POA: Diagnosis not present

## 2019-03-22 DIAGNOSIS — Z7189 Other specified counseling: Secondary | ICD-10-CM | POA: Diagnosis not present

## 2019-04-30 ENCOUNTER — Ambulatory Visit: Payer: Medicare PPO | Attending: Internal Medicine

## 2019-04-30 DIAGNOSIS — Z23 Encounter for immunization: Secondary | ICD-10-CM | POA: Insufficient documentation

## 2019-04-30 NOTE — Progress Notes (Signed)
   Covid-19 Vaccination Clinic  Name:  ORION MOLE    MRN: [id number] DOB: 1934-08-31  04/30/2019  Mr. Zerby was observed post Covid-19 immunization for 15 minutes without incidence. He was provided with Vaccine Information Sheet and instruction to access the V-Safe system.   Mr. Vacca was instructed to call 911 with any severe reactions post vaccine: Marland Kitchen Difficulty breathing  . Swelling of your face and throat  . A fast heartbeat  . A bad rash all over your body  . Dizziness and weakness    Immunizations Administered    Name Date Dose VIS Date Route   Moderna COVID-19 Vaccine 04/30/2019 12:10 PM 0.5 mL 02/15/2019 Intramuscular   Manufacturer: Moderna   Lot: [id number]   NDC: 34949-447-39

## 2019-05-28 ENCOUNTER — Ambulatory Visit: Payer: Medicare PPO | Attending: Internal Medicine

## 2019-05-28 DIAGNOSIS — Z23 Encounter for immunization: Secondary | ICD-10-CM

## 2019-05-28 NOTE — Progress Notes (Signed)
   Covid-19 Vaccination Clinic  Name:  Sean Stuart    MRN: [id number] DOB: 06-Dec-1934  05/28/2019  [name] was observed post Covid-19 immunization for 15 minutes without incident. He was provided with Vaccine Information Sheet and instruction to access the V-Safe system.   [name] was instructed to call 911 with any severe reactions post vaccine: Marland Kitchen Difficulty breathing  . Swelling of face and throat  . A fast heartbeat  . A bad rash all over body  . Dizziness and weakness   Immunizations Administered    Name Date Dose VIS Date Route   Moderna COVID-19 Vaccine 05/28/2019 10:21 AM 0.5 mL 02/15/2019 Intramuscular   Manufacturer: Moderna   Lot: [id number]   NDC: 86761-950-93

## 2019-06-20 DIAGNOSIS — B353 Tinea pedis: Secondary | ICD-10-CM | POA: Diagnosis not present

## 2019-06-20 DIAGNOSIS — E1169 Type 2 diabetes mellitus with other specified complication: Secondary | ICD-10-CM | POA: Diagnosis not present

## 2019-06-20 DIAGNOSIS — I10 Essential (primary) hypertension: Secondary | ICD-10-CM | POA: Diagnosis not present

## 2019-11-01 DIAGNOSIS — I10 Essential (primary) hypertension: Secondary | ICD-10-CM | POA: Diagnosis not present

## 2019-11-01 DIAGNOSIS — E1142 Type 2 diabetes mellitus with diabetic polyneuropathy: Secondary | ICD-10-CM | POA: Diagnosis not present

## 2019-12-13 DIAGNOSIS — J309 Allergic rhinitis, unspecified: Secondary | ICD-10-CM | POA: Diagnosis not present

## 2019-12-13 DIAGNOSIS — Z833 Family history of diabetes mellitus: Secondary | ICD-10-CM | POA: Diagnosis not present

## 2019-12-13 DIAGNOSIS — Z7982 Long term (current) use of aspirin: Secondary | ICD-10-CM | POA: Diagnosis not present

## 2019-12-13 DIAGNOSIS — E1165 Type 2 diabetes mellitus with hyperglycemia: Secondary | ICD-10-CM | POA: Diagnosis not present

## 2019-12-13 DIAGNOSIS — Z8249 Family history of ischemic heart disease and other diseases of the circulatory system: Secondary | ICD-10-CM | POA: Diagnosis not present

## 2019-12-13 DIAGNOSIS — Z803 Family history of malignant neoplasm of breast: Secondary | ICD-10-CM | POA: Diagnosis not present

## 2019-12-13 DIAGNOSIS — H409 Unspecified glaucoma: Secondary | ICD-10-CM | POA: Diagnosis not present

## 2019-12-13 DIAGNOSIS — I10 Essential (primary) hypertension: Secondary | ICD-10-CM | POA: Diagnosis not present

## 2019-12-13 DIAGNOSIS — Z7984 Long term (current) use of oral hypoglycemic drugs: Secondary | ICD-10-CM | POA: Diagnosis not present

## 2019-12-15 DIAGNOSIS — L308 Other specified dermatitis: Secondary | ICD-10-CM | POA: Diagnosis not present

## 2019-12-15 DIAGNOSIS — L820 Inflamed seborrheic keratosis: Secondary | ICD-10-CM | POA: Diagnosis not present

## 2019-12-27 DIAGNOSIS — I10 Essential (primary) hypertension: Secondary | ICD-10-CM | POA: Diagnosis not present

## 2019-12-27 DIAGNOSIS — E1169 Type 2 diabetes mellitus with other specified complication: Secondary | ICD-10-CM | POA: Diagnosis not present

## 2019-12-27 DIAGNOSIS — E1142 Type 2 diabetes mellitus with diabetic polyneuropathy: Secondary | ICD-10-CM | POA: Diagnosis not present

## 2020-04-03 DIAGNOSIS — B353 Tinea pedis: Secondary | ICD-10-CM | POA: Diagnosis not present

## 2020-04-03 DIAGNOSIS — I4940 Unspecified premature depolarization: Secondary | ICD-10-CM | POA: Diagnosis not present

## 2020-04-03 DIAGNOSIS — I10 Essential (primary) hypertension: Secondary | ICD-10-CM | POA: Diagnosis not present

## 2020-04-03 DIAGNOSIS — E1169 Type 2 diabetes mellitus with other specified complication: Secondary | ICD-10-CM | POA: Diagnosis not present

## 2020-04-23 DIAGNOSIS — I10 Essential (primary) hypertension: Secondary | ICD-10-CM | POA: Diagnosis not present

## 2020-04-23 DIAGNOSIS — M5416 Radiculopathy, lumbar region: Secondary | ICD-10-CM | POA: Diagnosis not present

## 2020-04-23 DIAGNOSIS — Z6829 Body mass index (BMI) 29.0-29.9, adult: Secondary | ICD-10-CM | POA: Diagnosis not present

## 2020-04-24 ENCOUNTER — Other Ambulatory Visit (HOSPITAL_COMMUNITY): Payer: Self-pay | Admitting: Neurosurgery

## 2020-04-24 ENCOUNTER — Other Ambulatory Visit: Payer: Self-pay | Admitting: Neurosurgery

## 2020-04-24 DIAGNOSIS — M5416 Radiculopathy, lumbar region: Secondary | ICD-10-CM

## 2020-05-02 ENCOUNTER — Other Ambulatory Visit: Payer: Self-pay

## 2020-05-02 ENCOUNTER — Ambulatory Visit (HOSPITAL_COMMUNITY): Payer: Medicare PPO | Attending: Neurosurgery | Admitting: Physical Therapy

## 2020-05-02 DIAGNOSIS — M545 Low back pain, unspecified: Secondary | ICD-10-CM | POA: Insufficient documentation

## 2020-05-02 DIAGNOSIS — M6281 Muscle weakness (generalized): Secondary | ICD-10-CM | POA: Diagnosis not present

## 2020-05-02 DIAGNOSIS — R2689 Other abnormalities of gait and mobility: Secondary | ICD-10-CM | POA: Diagnosis not present

## 2020-05-02 NOTE — Patient Instructions (Signed)
Access Code: [email or web address] URL: [email or web address] Date: 05/02/2020 Prepared by: Greig Castilla Anarosa Kubisiak  Exercises Hooklying Single Knee to Chest Stretch - 2 x daily - 7 x weekly - 4 reps - 30 second hold

## 2020-05-02 NOTE — Therapy (Signed)
[facility] [address] [place], [place], [address] Phone: [phone]   Fax:  [phone]  Physical Therapy Evaluation  Patient Details  Name: Sean Stuart MRN: [id number] Date of Birth: 02/19/1935 Referring Provider (PT): Hoyt KochJonathan Thomas MD   Encounter Date: 05/02/2020   PT End of Session - 05/02/20 1522    Visit Number 1    Number of Visits 12    Date for PT Re-Evaluation 06/13/20    Authorization Type [employer]    Authorization Time Period 12 visits requested 2/16-3/30 - check auth    Authorization - Visit Number 1    Authorization - Number of Visits 12    Progress Note Due on Visit 10    PT Start Time 1445    PT Stop Time 1520    PT Time Calculation (min) 35 min    Activity Tolerance Patient tolerated treatment well;Patient limited by pain    Behavior During Therapy [facility] for tasks assessed/performed           Past Medical History:  Diagnosis Date  . Arthritis    knees  . Asthma   . Diabetes mellitus   . Hypertension   . Sleep apnea    STOP BANG score 5    Past Surgical History:  Procedure Laterality Date  . CATARACT EXTRACTION W/PHACO  06/19/2011   Procedure: CATARACT EXTRACTION PHACO AND INTRAOCULAR LENS PLACEMENT (IOC);  Surgeon: Gemma PayorKerry Hunt, MD;  Location: [facility];  Service: Ophthalmology;  Laterality: Left;  CDE: 12.83  . CATARACT EXTRACTION W/PHACO  07/07/2011   Procedure: CATARACT EXTRACTION PHACO AND INTRAOCULAR LENS PLACEMENT (IOC);  Surgeon: Gemma PayorKerry Hunt, MD;  Location: [facility];  Service: Ophthalmology;  Laterality: Right;  CDE 12.82  . LIPOMA EXCISION    . right leg surgery     from [facility]    There were no vitals filed for this visit.    Subjective Assessment - 05/02/20 1444    Subjective Patient is a 86 y.o. male who presents to physical therapy with c/o LBP with R radiculopathy. He states R sided low back/gluteal pain that goes down into his R foot. He states tingling/needles all the way down.  Currently it is down to his knee. Symptoms began with sitting with the snow coming down for several days earlier this year. He is normally very active. Symptoms are worse with walking. Symptoms ease with rest and medications. His main goal is to decrease back pain.    Limitations House hold activities;Walking;Standing    How long can you walk comfortably? 10 minutes    Patient Stated Goals decrease back pain    Currently in Pain? Yes    Pain Score 6     Pain Location Back    Pain Orientation Right;Lower    Pain Type Acute pain    Pain Onset 1 to 4 weeks ago    Pain Frequency Constant              [facility] PT Assessment - 05/02/20 0001      Assessment   Medical Diagnosis LBP w/ R radiculopathy    Referring Provider (PT) Hoyt KochJonathan Thomas MD    Onset Date/Surgical Date 03/26/20    Next MD Visit 6 weeks    Prior Therapy none      Precautions   Precautions None      Restrictions   Weight Bearing Restrictions No      Balance Screen   Has the patient fallen in  the past 6 months No    Has the patient had a decrease in activity level because of a fear of falling?  No    Is the patient reluctant to leave their home because of a fear of falling?  No      Prior Function   Level of Independence Independent    Vocation Retired      Copy Status Within Functional Limits for tasks assessed      Observation/Other Assessments   Observations Antalgic gait without AD    Focus on Therapeutic Outcomes (FOTO)  60% limited      Sensation   Light Touch Appears Intact      ROM / Strength   AROM / PROM / Strength AROM;Strength      AROM   AROM Assessment Site Lumbar    Lumbar Flexion 0% limited    Lumbar Extension 50% limited pain increases in back and down to knee    Lumbar - Right Side Bend 0% limited    Lumbar - Left Side Bend 0% limited    Lumbar - Right Rotation 0% limited    Lumbar - Left Rotation 0% limited      Strength   Strength Assessment Site  Hip;Knee;Ankle    Right/Left Hip Right;Left    Right Hip Flexion 3+/5    Right Hip Extension 3-/5    Left Hip Flexion 4-/5    Left Hip Extension 3-/5    Right/Left Knee Right;Left    Right Knee Flexion 5/5    Right Knee Extension 5/5    Left Knee Flexion 5/5    Left Knee Extension 5/5    Right/Left Ankle Right;Left    Right Ankle Dorsiflexion 4+/5    Left Ankle Dorsiflexion 4+/5      Palpation   Palpation comment TTP lower lumbar paraspinals, R glute max/med, hyperactive throughout; recreates tingling in RLE      Ambulation/Gait   Ambulation/Gait Yes    Ambulation/Gait Assistance 5: Supervision    Ambulation Distance (Feet) 310 Feet    Assistive device None    Gait Pattern Trunk flexed;Trendelenburg    Ambulation Surface Level;Indoor    Gait velocity decreased    Gait Comments , increased R lateral and forward flexion with fatigue, pain into RLE increasing down to knee                      Objective measurements completed on examination: See above findings.       [facility] Adult PT Treatment/Exercise - 05/02/20 0001      Exercises   Exercises Lumbar      Lumbar Exercises: Stretches   Single Knee to Chest Stretch 3 reps;30 seconds;Right                  PT Education - 05/02/20 1444    Education Details Patient educated on exam findings, POC, scope of PT, HEP    Person(s) Educated Patient    Methods Explanation;Demonstration    Comprehension Verbalized understanding;Returned demonstration            PT Short Term Goals - 05/02/20 1529      PT SHORT TERM GOAL #1   Title Patient will be independent with HEP in order to improve functional outcomes.    Time 3    Period Weeks    Status New    Target Date 05/23/20      PT SHORT TERM GOAL #2  Title Patient will report at least 25% improvement in symptoms for improved quality of life.    Time 3    Period Weeks    Status New    Target Date 05/23/20             PT Long Term Goals -  05/02/20 1529      PT LONG TERM GOAL #1   Title Patient will report at least 75% improvement in symptoms for improved quality of life.    Time 6    Period Weeks    Status New    Target Date 06/13/20      PT LONG TERM GOAL #2   Title Patient will improve FOTO score by at least 10 points in order to indicate improved tolerance to activity.    Time 6    Period Weeks    Status New    Target Date 06/13/20      PT LONG TERM GOAL #3   Title Patient will be able to ambulate at least 400 feet in in order to demonstrate improved gait speed for community ambulation.    Time 6    Period Weeks    Status New    Target Date 06/13/20      PT LONG TERM GOAL #4   Title Patient will demonstrate grade of 4/5 MMT grade in all tested musculature as evidence of improved strength to assist with stair ambulation and gait.    Time 6    Period Weeks    Status New    Target Date 06/13/20                  Plan - 05/02/20 1525    Clinical Impression Statement Patient is a 86 y.o. male who presents to physical therapy with c/o LBP with R radiculopathy. He presents with pain limited deficits in lumbar strength, ROM, endurance, postural impairments, hyperactive and tender musculature, gait, and functional mobility with ADL. He is having to modify and restrict ADL as indicated by FOTO score as well as subjective information and objective measures which is affecting overall participation. Patient will benefit from skilled physical therapy in order to improve function and reduce impairment.    Personal Factors and Comorbidities Age;Fitness;Behavior Pattern;Past/Current Experience;Comorbidity 2    Comorbidities DM HTN    Examination-Activity Limitations Locomotion Level;Transfers;Stand;Stairs;Squat;Lift;Carry    Examination-Participation Restrictions Meal Prep;Church;Cleaning;Yard Work;Volunteer;Shop    Stability/Clinical Decision Making Stable/Uncomplicated    Clinical Decision Making Low    Rehab  Potential Good    PT Frequency 2x / week    PT Duration 6 weeks    PT Treatment/Interventions ADLs/Self Care Home Management;Aquatic Therapy;Cryotherapy;Electrical Stimulation;Iontophoresis 4mg /ml Dexamethasone;Moist Heat;Traction;DME Instruction;Gait training;Stair training;Functional mobility training;Therapeutic activities;Therapeutic exercise;Patient/family education;Neuromuscular re-education;Balance training;Orthotic Fit/Training;Manual techniques;Compression bandaging;Manual lymph drainage;Dry needling;Energy conservation;Splinting;Taping;Spinal Manipulations;Joint Manipulations    PT Next Visit Plan f/u with [facility], possibly manual for pain, begin hip and core strengthening    PT Home Exercise Plan 2/16 [facility]    Consulted and Agree with Plan of Care Patient           Patient will benefit from skilled therapeutic intervention in order to improve the following deficits and impairments:  Abnormal gait,Difficulty walking,Decreased endurance,Increased muscle spasms,Decreased activity tolerance,Pain,Decreased balance,Impaired flexibility,Improper body mechanics,Postural dysfunction,Decreased strength,Decreased mobility  Visit Diagnosis: Low back pain, unspecified back pain laterality, unspecified chronicity, unspecified whether sciatica present  Muscle weakness (generalized)  Other abnormalities of gait and mobility     Problem List Patient Active Problem List   Diagnosis  Date Noted  . Essential hypertension 10/18/2015  . Uncontrolled type 2 diabetes mellitus with hyperglycemia, without long-term current use of insulin (HCC) 10/18/2015    3:31 PM, 05/02/20 Wyman Songster PT, DPT [facility] Therap[facility]   [place] [facility] [address] [place], [place], [address] Phone: [phone]   Fax:  [phone]  Name: Sean Stuart MRN: [id number] Date of Birth: [birth year]

## 2020-05-07 ENCOUNTER — Other Ambulatory Visit: Payer: Self-pay

## 2020-05-07 ENCOUNTER — Ambulatory Visit (HOSPITAL_COMMUNITY)
Admission: RE | Admit: 2020-05-07 | Discharge: 2020-05-07 | Disposition: A | Discharge status: Home / Self Care | Payer: Medicare PPO | Source: Ambulatory Visit | Attending: Neurosurgery | Admitting: Neurosurgery

## 2020-05-07 DIAGNOSIS — M5416 Radiculopathy, lumbar region: Secondary | ICD-10-CM | POA: Insufficient documentation

## 2020-05-07 DIAGNOSIS — M545 Low back pain, unspecified: Secondary | ICD-10-CM | POA: Diagnosis not present

## 2020-05-10 ENCOUNTER — Ambulatory Visit (HOSPITAL_COMMUNITY): Payer: Medicare PPO | Admitting: Physical Therapy

## 2020-05-10 ENCOUNTER — Encounter (HOSPITAL_COMMUNITY): Payer: Self-pay | Admitting: Physical Therapy

## 2020-05-10 ENCOUNTER — Other Ambulatory Visit: Payer: Self-pay

## 2020-05-10 DIAGNOSIS — M6281 Muscle weakness (generalized): Secondary | ICD-10-CM

## 2020-05-10 DIAGNOSIS — R2689 Other abnormalities of gait and mobility: Secondary | ICD-10-CM

## 2020-05-10 DIAGNOSIS — M545 Low back pain, unspecified: Secondary | ICD-10-CM

## 2020-05-10 NOTE — Therapy (Signed)
[facility] [address] [place], [place], [address] Phone: [phone]   Fax:  [phone]  Physical Therapy Treatment  Patient Details  Name: Sean Stuart MRN: [id number] Date of Birth: 1934-09-16 Referring Provider (PT): Hoyt Koch MD   Encounter Date: 05/10/2020   PT End of Session - 05/10/20 1537    Visit Number 2    Number of Visits 12    Date for PT Re-Evaluation 06/13/20    Authorization Type [employer]    Authorization Time Period 12 visits requested 2/16-3/30    Authorization - Visit Number 2    Authorization - Number of Visits 12    Progress Note Due on Visit 10    PT Start Time 1536    PT Stop Time 1615    PT Time Calculation (min) 39 min    Activity Tolerance Patient tolerated treatment well    Behavior During Therapy [facility] for tasks assessed/performed           Past Medical History:  Diagnosis Date  . Arthritis    knees  . Asthma   . Diabetes mellitus   . Hypertension   . Sleep apnea    STOP BANG score 5    Past Surgical History:  Procedure Laterality Date  . CATARACT EXTRACTION W/PHACO  06/19/2011   Procedure: CATARACT EXTRACTION PHACO AND INTRAOCULAR LENS PLACEMENT (IOC);  Surgeon: Gemma Payor, MD;  Location: [facility];  Service: Ophthalmology;  Laterality: Left;  CDE: 12.83  . CATARACT EXTRACTION W/PHACO  07/07/2011   Procedure: CATARACT EXTRACTION PHACO AND INTRAOCULAR LENS PLACEMENT (IOC);  Surgeon: Gemma Payor, MD;  Location: [facility];  Service: Ophthalmology;  Laterality: Right;  CDE 12.82  . LIPOMA EXCISION    . right leg surgery     from [facility]    There were no vitals filed for this visit.   Subjective Assessment - 05/10/20 1538    Subjective Patient states his back is feeling a lot better. The stretch is doing good. His sleeping has been better.    Limitations House hold activities;Walking;Standing    How long can you walk comfortably? 10 minutes    Patient Stated Goals decrease back pain     Currently in Pain? No/denies    Pain Onset 1 to 4 weeks ago                             [facility] Adult PT Treatment/Exercise - 05/10/20 0001      Lumbar Exercises: Stretches   Single Knee to Chest Stretch 3 reps;30 seconds;Right      Lumbar Exercises: Standing   Other Standing Lumbar Exercises lateral stepping 4x 20 feet bilateral      Lumbar Exercises: Seated   Sit to Stand 5 reps    Other Seated Lumbar Exercises hip flexion 10x 5 second holds bilateral      Lumbar Exercises: Supine   Bridge 10 reps    Bridge Limitations 2 sets    Straight Leg Raise 10 reps    Other Supine Lumbar Exercises glute sets 10x 5 second holds                  PT Education - 05/10/20 1538    Education Details HEP, exercise mechanics    Person(s) Educated Patient    Methods Explanation;Demonstration    Comprehension Verbalized understanding;Returned demonstration            PT  Short Term Goals - 05/02/20 1529      PT SHORT TERM GOAL #1   Title Patient will be independent with HEP in order to improve functional outcomes.    Time 3    Period Weeks    Status New    Target Date 05/23/20      PT SHORT TERM GOAL #2   Title Patient will report at least 25% improvement in symptoms for improved quality of life.    Time 3    Period Weeks    Status New    Target Date 05/23/20             PT Long Term Goals - 05/02/20 1529      PT LONG TERM GOAL #1   Title Patient will report at least 75% improvement in symptoms for improved quality of life.    Time 6    Period Weeks    Status New    Target Date 06/13/20      PT LONG TERM GOAL #2   Title Patient will improve FOTO score by at least 10 points in order to indicate improved tolerance to activity.    Time 6    Period Weeks    Status New    Target Date 06/13/20      PT LONG TERM GOAL #3   Title Patient will be able to ambulate at least 400 feet in in order to demonstrate improved gait speed for community  ambulation.    Time 6    Period Weeks    Status New    Target Date 06/13/20      PT LONG TERM GOAL #4   Title Patient will demonstrate grade of 4/5 MMT grade in all tested musculature as evidence of improved strength to assist with stair ambulation and gait.    Time 6    Period Weeks    Status New    Target Date 06/13/20                 Plan - 05/10/20 1538    Clinical Impression Statement Patient with anterior hip pain with attempted piriformis stretch initially, discontinued to due to pain. Patient with c/o hip flexor pain with SLR initially and requires cueing for decreased height of flexion and for controlled movements. Patient tolerates bridge exercise following cueing for prior glute activation. Patient with c/o fatigue throughout session secondary to impaired activity tolerance. Patient will continue to benefit from skilled physical therapy in order to improve function and reduce impairment.    Personal Factors and Comorbidities Age;Fitness;Behavior Pattern;Past/Current Experience;Comorbidity 2    Comorbidities DM HTN    Examination-Activity Limitations Locomotion Level;Transfers;Stand;Stairs;Squat;Lift;Carry    Examination-Participation Restrictions Meal Prep;Church;Cleaning;Yard Work;Volunteer;Shop    Stability/Clinical Decision Making Stable/Uncomplicated    Rehab Potential Good    PT Frequency 2x / week    PT Duration 6 weeks    PT Treatment/Interventions ADLs/Self Care Home Management;Aquatic Therapy;Cryotherapy;Electrical Stimulation;Iontophoresis 4mg /ml Dexamethasone;Moist Heat;Traction;DME Instruction;Gait training;Stair training;Functional mobility training;Therapeutic activities;Therapeutic exercise;Patient/family education;Neuromuscular re-education;Balance training;Orthotic Fit/Training;Manual techniques;Compression bandaging;Manual lymph drainage;Dry needling;Energy conservation;Splinting;Taping;Spinal Manipulations;Joint Manipulations    PT Next Visit Plan  possibly manual for pain, continue hip and core strengthening    PT Home Exercise Plan 2/16 [facility] 2/24 bridge, STS, seated march    Consulted and Agree with Plan of Care Patient           Patient will benefit from skilled therapeutic intervention in order to improve the following deficits and impairments:  Abnormal gait,Difficulty walking,Decreased endurance,Increased  muscle spasms,Decreased activity tolerance,Pain,Decreased balance,Impaired flexibility,Improper body mechanics,Postural dysfunction,Decreased strength,Decreased mobility  Visit Diagnosis: Low back pain, unspecified back pain laterality, unspecified chronicity, unspecified whether sciatica present  Muscle weakness (generalized)  Other abnormalities of gait and mobility     Problem List Patient Active Problem List   Diagnosis Date Noted  . Essential hypertension 10/18/2015  . Uncontrolled type 2 diabetes mellitus with hyperglycemia, without long-term current use of insulin (HCC) 10/18/2015    4:15 PM, 05/10/20 Wyman Songster PT, DPT Physical Therapist at [facility]   [facility] [address] [place], [place], [address] Phone: [phone]   Fax:  [phone]  Name: KAJ VASIL MRN: [id number] Date of Birth: Mar 07, 1935

## 2020-05-10 NOTE — Patient Instructions (Signed)
Access Code: [email or web address] URL: [email or web address] Date: 05/10/2020 Prepared by: Alonna Minium  Exercises Seated March - 1 x daily - 7 x weekly - 10 reps - 5 second hold Sit to Stand with Arms Crossed - 1 x daily - 7 x weekly - 10 reps Supine Bridge - 1 x daily - 7 x weekly - 10 reps

## 2020-05-14 ENCOUNTER — Ambulatory Visit (HOSPITAL_COMMUNITY): Payer: Medicare PPO | Admitting: Physical Therapy

## 2020-05-16 ENCOUNTER — Ambulatory Visit (HOSPITAL_COMMUNITY): Payer: Medicare PPO | Attending: Neurosurgery | Admitting: Physical Therapy

## 2020-05-16 ENCOUNTER — Encounter (HOSPITAL_COMMUNITY): Payer: Self-pay | Admitting: Physical Therapy

## 2020-05-16 ENCOUNTER — Other Ambulatory Visit: Payer: Self-pay

## 2020-05-16 DIAGNOSIS — R2689 Other abnormalities of gait and mobility: Secondary | ICD-10-CM | POA: Insufficient documentation

## 2020-05-16 DIAGNOSIS — M6281 Muscle weakness (generalized): Secondary | ICD-10-CM | POA: Insufficient documentation

## 2020-05-16 DIAGNOSIS — M545 Low back pain, unspecified: Secondary | ICD-10-CM | POA: Diagnosis not present

## 2020-05-16 NOTE — Patient Instructions (Signed)
Access Code: [email or web address] URL: [email or web address] Date: 05/16/2020 Prepared by: Greig Castilla Jelitza Manninen  Exercises Prone Hip Extension - 1 x daily - 7 x weekly - 10 reps

## 2020-05-16 NOTE — Therapy (Signed)
[facility] [address] [place], [place], [address] Phone: [phone]   Fax:  [phone]  Physical Therapy Treatment/Discharge Summary  Patient Details  Name: Sean Stuart MRN: [id number] Date of Birth: [birth year] Referring Provider (PT): Duffy Rhody MD   Encounter Date: 05/16/2020  PHYSICAL THERAPY DISCHARGE SUMMARY  Visits from Start of Care: 3  Current functional level related to goals / functional outcomes: See below   Remaining deficits: See below   Education / Equipment: See below  Plan: Patient agrees to discharge.  Patient goals were met. Patient is being discharged due to meeting the stated rehab goals.  ?????        PT End of Session - 05/16/20 1449    Visit Number 3    Number of Visits 12    Date for PT Re-Evaluation 06/13/20    Authorization Type [employer]    Authorization Time Period 12 visits requested 2/16-3/30    Authorization - Visit Number 3    Authorization - Number of Visits 12    Progress Note Due on Visit 10    PT Start Time 4332    PT Stop Time 1510    PT Time Calculation (min) 21 min    Activity Tolerance Patient tolerated treatment well    Behavior During Therapy WFL for tasks assessed/performed           Past Medical History:  Diagnosis Date  . Arthritis    knees  . Asthma   . Diabetes mellitus   . Hypertension   . Sleep apnea    STOP BANG score 5    Past Surgical History:  Procedure Laterality Date  . CATARACT EXTRACTION W/PHACO  06/19/2011   Procedure: CATARACT EXTRACTION PHACO AND INTRAOCULAR LENS PLACEMENT (IOC);  Surgeon: Tonny Branch, MD;  Location: [facility];  Service: Ophthalmology;  Laterality: Left;  CDE: 12.83  . CATARACT EXTRACTION W/PHACO  07/07/2011   Procedure: CATARACT EXTRACTION PHACO AND INTRAOCULAR LENS PLACEMENT (IOC);  Surgeon: Tonny Branch, MD;  Location: [facility];  Service: Ophthalmology;  Laterality: Right;  CDE 12.82  . LIPOMA EXCISION    . right  leg surgery     from [facility]    There were no vitals filed for this visit.   Subjective Assessment - 05/16/20 1450    Subjective Patient states he is feeling tired. His back has not been bothering him. Patient states 100% improvement with phsyical therapy intervention.    Limitations House hold activities;Walking;Standing    How long can you walk comfortably? 10 minutes    Patient Stated Goals decrease back pain    Currently in Pain? No/denies    Pain Onset 1 to 4 weeks ago              [facility] PT Assessment - 05/16/20 0001      Assessment   Medical Diagnosis LBP w/ R radiculopathy    Referring Provider (PT) Duffy Rhody MD    Onset Date/Surgical Date 03/26/20    Next MD Visit 6 weeks    Prior Therapy none      Precautions   Precautions None      Restrictions   Weight Bearing Restrictions No      Balance Screen   Has the patient fallen in the past 6 months No    Has the patient had a decrease in activity level because of a fear of falling?  No    Is the patient reluctant to leave their  home because of a fear of falling?  No      Prior Function   Level of Independence Independent    Vocation Retired      Charity fundraiser Status Within Functional Limits for tasks assessed      Observation/Other Assessments   Observations Antalgic gait without AD    Focus on Therapeutic Outcomes (FOTO)  94% function      Sensation   Light Touch Appears Intact      AROM   Lumbar Flexion 0% limited    Lumbar Extension 25% limited    Lumbar - Right Side Bend 0% limited    Lumbar - Left Side Bend 0% limited    Lumbar - Right Rotation 0% limited    Lumbar - Left Rotation 0% limited      Strength   Right Hip Flexion 4/5    Right Hip Extension 3/5    Left Hip Flexion 4+/5    Left Hip Extension 3/5    Right Knee Flexion 5/5    Right Knee Extension 5/5    Left Knee Flexion 5/5    Left Knee Extension 5/5    Right Ankle Dorsiflexion 5/5    Left Ankle Dorsiflexion 5/5       Ambulation/Gait   Ambulation/Gait Yes    Ambulation/Gait Assistance 7: Independent    Ambulation Distance (Feet) 425 Feet    Assistive device None    Gait Pattern Trunk flexed   with fatigue   Ambulation Surface Level;Indoor    Gait velocity decreased    Gait Comments 2MWT                                 PT Education - 05/16/20 1450    Education Details HEP, exercise mechanics    Person(s) Educated Patient    Methods Explanation;Demonstration    Comprehension Verbalized understanding;Returned demonstration            PT Short Term Goals - 05/16/20 1455      PT SHORT TERM GOAL #1   Title Patient will be independent with HEP in order to improve functional outcomes.    Time 3    Period Weeks    Status Achieved    Target Date 05/23/20      PT SHORT TERM GOAL #2   Title Patient will report at least 25% improvement in symptoms for improved quality of life.    Time 3    Period Weeks    Status Achieved    Target Date 05/23/20             PT Long Term Goals - 05/16/20 1456      PT LONG TERM GOAL #1   Title Patient will report at least 75% improvement in symptoms for improved quality of life.    Time 6    Period Weeks    Status Achieved      PT LONG TERM GOAL #2   Title Patient will improve FOTO score by at least 10 points in order to indicate improved tolerance to activity.    Time 6    Period Weeks    Status Achieved      PT LONG TERM GOAL #3   Title Patient will be able to ambulate at least 400 feet in 2MWT in order to demonstrate improved gait speed for community ambulation.    Time 6    Period Weeks  Status Achieved      PT LONG TERM GOAL #4   Title Patient will demonstrate grade of 4/5 MMT grade in all tested musculature as evidence of improved strength to assist with stair ambulation and gait.    Time 6    Period Weeks    Status Partially Met                 Plan - 05/16/20 1449    Clinical Impression  Statement Patient has met all short and long term goals with last long term goal being partially met. Patient is independent with HEP and has improvement in symptoms, functional mobility, gait, transfers, activity tolerance, and functional mobility. Last goal met due to improving strength but slightly lacking in hip extension strength. Patient no longer having symptoms and is living active lifestyle. Patient educated on continuing HEP and returning to physical therapy if needed. Patient discharged from physical therapy at this time.    Personal Factors and Comorbidities Age;Fitness;Behavior Pattern;Past/Current Experience;Comorbidity 2    Comorbidities DM HTN    Examination-Activity Limitations Locomotion Level;Transfers;Stand;Stairs;Squat;Lift;Carry    Examination-Participation Restrictions Meal Prep;Church;Cleaning;Yard Work;Volunteer;Shop    Stability/Clinical Decision Making Stable/Uncomplicated    Rehab Potential Good    PT Frequency --    PT Duration --    PT Treatment/Interventions ADLs/Self Care Home Management;Aquatic Therapy;Cryotherapy;Electrical Stimulation;Iontophoresis 4mg /ml Dexamethasone;Moist Heat;Traction;DME Instruction;Gait training;Stair training;Functional mobility training;Therapeutic activities;Therapeutic exercise;Patient/family education;Neuromuscular re-education;Balance training;Orthotic Fit/Training;Manual techniques;Compression bandaging;Manual lymph drainage;Dry needling;Energy conservation;Splinting;Taping;Spinal Manipulations;Joint Manipulations    PT Next Visit Plan n/a    PT Home Exercise Plan 2/16 [facility] 2/24 bridge, STS, seated march 3/2 prone hip extension    Consulted and Agree with Plan of Care Patient           Patient will benefit from skilled therapeutic intervention in order to improve the following deficits and impairments:  Abnormal gait,Difficulty walking,Decreased endurance,Increased muscle spasms,Decreased activity tolerance,Pain,Decreased  balance,Impaired flexibility,Improper body mechanics,Postural dysfunction,Decreased strength,Decreased mobility  Visit Diagnosis: Low back pain, unspecified back pain laterality, unspecified chronicity, unspecified whether sciatica present  Muscle weakness (generalized)  Other abnormalities of gait and mobility     Problem List Patient Active Problem List   Diagnosis Date Noted  . Essential hypertension 10/18/2015  . Uncontrolled type 2 diabetes mellitus with hyperglycemia, without long-term current use of insulin ([facility]) 10/18/2015    3:16 PM, 05/16/20 Mearl Latin PT, DPT Physical Therapist at [place], [place], [address] Phone: [phone]   Fax:  [phone]  Name: SOLLY DERASMO MRN: [id number] Date of Birth: [birth year]

## 2020-05-21 ENCOUNTER — Ambulatory Visit (HOSPITAL_COMMUNITY): Payer: Medicare PPO | Admitting: Physical Therapy

## 2020-05-22 ENCOUNTER — Encounter (HOSPITAL_COMMUNITY): Payer: Medicare PPO | Admitting: Physical Therapy

## 2020-05-23 ENCOUNTER — Encounter (HOSPITAL_COMMUNITY): Payer: Medicare PPO | Admitting: Physical Therapy

## 2020-05-28 ENCOUNTER — Encounter (HOSPITAL_COMMUNITY): Payer: Medicare PPO | Admitting: Physical Therapy

## 2020-05-31 ENCOUNTER — Encounter (HOSPITAL_COMMUNITY): Payer: Medicare PPO | Admitting: Physical Therapy

## 2020-06-05 DIAGNOSIS — M5416 Radiculopathy, lumbar region: Secondary | ICD-10-CM | POA: Diagnosis not present

## 2020-06-06 ENCOUNTER — Encounter (HOSPITAL_COMMUNITY): Payer: Medicare PPO | Admitting: Physical Therapy

## 2020-06-07 ENCOUNTER — Encounter (HOSPITAL_COMMUNITY): Payer: Medicare PPO | Admitting: Physical Therapy

## 2020-06-11 ENCOUNTER — Encounter (HOSPITAL_COMMUNITY): Payer: Medicare PPO | Admitting: Physical Therapy

## 2020-06-14 ENCOUNTER — Encounter (HOSPITAL_COMMUNITY): Payer: Medicare PPO | Admitting: Physical Therapy

## 2020-07-02 DIAGNOSIS — Z0000 Encounter for general adult medical examination without abnormal findings: Secondary | ICD-10-CM | POA: Diagnosis not present

## 2020-07-02 DIAGNOSIS — I10 Essential (primary) hypertension: Secondary | ICD-10-CM | POA: Diagnosis not present

## 2020-07-02 DIAGNOSIS — M67431 Ganglion, right wrist: Secondary | ICD-10-CM | POA: Diagnosis not present

## 2020-07-02 DIAGNOSIS — E1169 Type 2 diabetes mellitus with other specified complication: Secondary | ICD-10-CM | POA: Diagnosis not present

## 2020-07-02 DIAGNOSIS — B353 Tinea pedis: Secondary | ICD-10-CM | POA: Diagnosis not present

## 2020-08-25 ENCOUNTER — Ambulatory Visit
Admission: EM | Admit: 2020-08-25 | Discharge: 2020-08-25 | Disposition: A | Discharge status: Home / Self Care | Payer: Medicare PPO | Attending: Family Medicine | Admitting: Family Medicine

## 2020-08-25 ENCOUNTER — Other Ambulatory Visit: Payer: Self-pay

## 2020-08-25 ENCOUNTER — Encounter: Payer: Self-pay | Admitting: Emergency Medicine

## 2020-08-25 DIAGNOSIS — J209 Acute bronchitis, unspecified: Secondary | ICD-10-CM

## 2020-08-25 DIAGNOSIS — Z20822 Contact with and (suspected) exposure to covid-19: Secondary | ICD-10-CM

## 2020-08-25 MED ORDER — BENZONATATE 100 MG PO CAPS: 200.0000 mg | ORAL_CAPSULE | Freq: Three times a day (TID) | ORAL | 0 refills | Status: DC | PRN

## 2020-08-25 MED ORDER — ALBUTEROL SULFATE HFA 108 (90 BASE) MCG/ACT IN AERS
2.0000 | INHALATION_SPRAY | Freq: Once | RESPIRATORY_TRACT | Status: AC
  Administered 2020-08-25: 10:00:00 2 via RESPIRATORY_TRACT

## 2020-08-25 NOTE — Discharge Instructions (Addendum)
I have sent in an albuterol inhaler for you to use 2 puffs every 4-6 hours as needed for cough, shortness of breath, wheezing.  I have sent in tessalon perles for you to use one capsule every 8 hours as needed for cough.  Your COVID and Influenza tests are pending.  You should self quarantine until the test results are back.    Take Tylenol or ibuprofen as needed for fever or discomfort.  Rest and keep yourself hydrated.    Follow-up with your primary care provider if your symptoms are not improving.

## 2020-08-25 NOTE — ED Triage Notes (Signed)
Symptoms started yesterday afternoon.  Fever, cough, and chills.

## 2020-08-25 NOTE — ED Provider Notes (Addendum)
[facility]   [id number] 08/25/20 Arrival Time: 2094   CC: COVID symptoms  SUBJECTIVE: History from: patient.  Sean Stuart is a 86 y.o. male who presents with cough, chills, body aches and fever for the last day. Denies sick exposure to COVID, flu or strep. Denies recent travel. Has negative history of Covid. Has completed Covid vaccines. Has completed flu vaccine. Has not taken OTC medications for this. There are no aggravating or alleviating factors. Denies previous symptoms in the past. Denies sinus pain, rhinorrhea, sore throat, wheezing, chest pain, nausea, changes in bowel or bladder habits. Denies hx COPD.  ROS: As per HPI.  All other pertinent ROS negative.     Past Medical History:  Diagnosis Date   Arthritis    knees   Asthma    Diabetes mellitus    Hypertension    Sleep apnea    STOP BANG score 5   Past Surgical History:  Procedure Laterality Date   CATARACT EXTRACTION W/PHACO  06/19/2011   Procedure: CATARACT EXTRACTION PHACO AND INTRAOCULAR LENS PLACEMENT (IOC);  Surgeon: Gemma Payor, MD;  Location: [facility];  Service: Ophthalmology;  Laterality: Left;  CDE: 12.83   CATARACT EXTRACTION W/PHACO  07/07/2011   Procedure: CATARACT EXTRACTION PHACO AND INTRAOCULAR LENS PLACEMENT (IOC);  Surgeon: Gemma Payor, MD;  Location: [facility];  Service: Ophthalmology;  Laterality: Right;  CDE 12.82   LIPOMA EXCISION     right leg surgery     from [facility]   No Known Allergies No current facility-administered medications on file prior to encounter.   Current Outpatient Medications on File Prior to Encounter  Medication Sig Dispense Refill   amLODipine-atorvastatin (CADUET) 5-40 MG tablet Take 1 tablet by mouth daily.     aspirin EC 81 MG tablet Take 81 mg by mouth every morning.     bimatoprost (LUMIGAN) 0.01 % SOLN Place 1 drop into both eyes every morning.     glimepiride (AMARYL) 4 MG tablet TAKE 1 TABLET BY MOUTH ONCE DAILY BEFORE  BREAKFAST 30 tablet 2   glucose blood  (ONETOUCH VERIO) test strip Use to check blood sugar 1 times per day. 50 each 2   pioglitazone (ACTOS) 15 MG tablet TAKE 1 TABLET BY MOUTH ONCE DAILY IN THE MORNING 90 tablet 2   potassium chloride (K-DUR) 10 MEQ tablet TAKE 1 TABLET BY MOUTH ONCE DAILY 15 tablet 0   Social History   Socioeconomic History   Marital status: Divorced    Spouse name: Not on file   Number of children: Not on file   Years of education: Not on file   Highest education level: Not on file  Occupational History   Not on file  Tobacco Use   Smoking status: Former    Pack years: 0.00   Smokeless tobacco: Never  Substance and Sexual Activity   Alcohol use: No    Comment: quit 1988   Drug use: No   Sexual activity: Not on file  Other Topics Concern   Not on file  Social History Narrative   Not on file   Social Determinants of Health   Financial Resource Strain: Not on file  Food Insecurity: Not on file  Transportation Needs: Not on file  Physical Activity: Not on file  Stress: Not on file  Social Connections: Not on file  Intimate Partner Violence: Not on file   Family History  Problem Relation Age of Onset   Diabetes Mother    CAD Father  Anesthesia problems Neg Hx    Hypotension Neg Hx    Malignant hyperthermia Neg Hx    Pseudochol deficiency Neg Hx     OBJECTIVE:  Vitals:   08/25/20 0942 08/25/20 1016  BP: (!) 152/80   Pulse: 65   Resp: 18   Temp: 100.3 F (37.9 C)   TempSrc: Oral   SpO2: 90% 92%     General appearance: alert; appears fatigued, but nontoxic; speaking in full sentences and tolerating own secretions HEENT: NCAT; Ears: EACs clear, TMs pearly gray; Eyes: PERRL.  EOM grossly intact. Sinuses: nontender; Nose: nares patent with clear rhinorrhea, Throat: oropharynx erythematous, cobblestoning present, tonsils non erythematous or enlarged, uvula midline  Neck: supple without LAD Lungs: unlabored respirations, symmetrical air entry; cough: moderate; no respiratory  distress; wheezing to bilateral lower lobes Heart: regular rate and rhythm.  Radial pulses 2+ symmetrical bilaterally Skin: warm and dry Psychological: alert and cooperative; normal mood and affect  LABS:  No results found for this or any previous visit (from the past 24 hour(s)).   ASSESSMENT & PLAN:  1. Acute bronchitis, unspecified organism   2. Exposure to COVID-19 virus     Meds ordered this encounter  Medications   albuterol (VENTOLIN HFA) 108 (90 Base) MCG/ACT inhaler 2 puff   benzonatate (TESSALON PERLES) 100 MG capsule    Sig: Take 2 capsules (200 mg total) by mouth 3 (three) times daily as needed for cough.    Dispense:  30 capsule    Refill:  0    Order Specific Question:   Supervising Provider    Answer:   Merrilee Jansky [[id number]]    Tessalon perles prescribed for cough prn Albuterol inhaler given in office today Discussed oxygen saturation being 90%, he is relaxed, no acute distress Keep an eye on this,  if it dips below 90%, go to the ER for further workup Oxygen up to 92% with inhaler Reviewed old xray form 2000, and showed hyperinflated lungs, suspect COPD and that his oxygen is typically a bit low Continue supportive care at home COVID and flu testing ordered.  It will take between 2-3 days for test results. Someone will contact you regarding abnormal results.   Patient should remain in quarantine until they have received Covid results.  If negative you may resume normal activities (go back to work/school) while practicing hand hygiene, social distance, and mask wearing.  If positive, patient should remain in quarantine for at least 5 days from symptom onset AND greater than 72 hours after symptoms resolution (absence of fever without the use of fever-reducing medication and improvement in respiratory symptoms), whichever is longer Get plenty of rest and push fluids Use OTC zyrtec for nasal congestion, runny nose, and/or sore throat Use OTC flonase for nasal  congestion and runny nose Use medications daily for symptom relief Use OTC medications like ibuprofen or tylenol as needed fever or pain Call or go to the ED if you have any new or worsening symptoms such as fever, worsening cough, shortness of breath, chest tightness, chest pain, turning blue, changes in mental status.  Reviewed expectations re: course of current medical issues. Questions answered. Outlined signs and symptoms indicating need for more acute intervention. Patient verbalized understanding. After Visit Summary given.          Moshe Cipro, NP 08/25/20 1005    Moshe Cipro, NP 08/25/20 1019

## 2020-08-26 LAB — COVID-19, FLU A+B NAA
Influenza A, NAA: NOT DETECTED
Influenza B, NAA: NOT DETECTED
SARS-CoV-2, NAA: DETECTED — AB

## 2020-10-29 DIAGNOSIS — I10 Essential (primary) hypertension: Secondary | ICD-10-CM | POA: Diagnosis not present

## 2020-10-29 DIAGNOSIS — E1169 Type 2 diabetes mellitus with other specified complication: Secondary | ICD-10-CM | POA: Diagnosis not present

## 2020-10-29 DIAGNOSIS — B353 Tinea pedis: Secondary | ICD-10-CM | POA: Diagnosis not present

## 2020-12-10 ENCOUNTER — Ambulatory Visit: Payer: Medicare PPO | Admitting: Cardiovascular Disease

## 2020-12-17 DIAGNOSIS — E1169 Type 2 diabetes mellitus with other specified complication: Secondary | ICD-10-CM | POA: Diagnosis not present

## 2020-12-17 DIAGNOSIS — I10 Essential (primary) hypertension: Secondary | ICD-10-CM | POA: Diagnosis not present

## 2020-12-17 DIAGNOSIS — Z125 Encounter for screening for malignant neoplasm of prostate: Secondary | ICD-10-CM | POA: Diagnosis not present

## 2020-12-17 DIAGNOSIS — M5451 Vertebrogenic low back pain: Secondary | ICD-10-CM | POA: Diagnosis not present

## 2020-12-17 DIAGNOSIS — M549 Dorsalgia, unspecified: Secondary | ICD-10-CM | POA: Diagnosis not present

## 2021-01-21 ENCOUNTER — Other Ambulatory Visit: Payer: Self-pay

## 2021-01-21 ENCOUNTER — Ambulatory Visit: Payer: Medicare PPO | Admitting: Internal Medicine

## 2021-01-21 ENCOUNTER — Encounter: Payer: Self-pay | Admitting: Internal Medicine

## 2021-01-21 VITALS — BP 142/78 | HR 60 | Ht 76.0 in | Wt 229.0 lb

## 2021-01-21 DIAGNOSIS — I77819 Aortic ectasia, unspecified site: Secondary | ICD-10-CM

## 2021-01-21 DIAGNOSIS — I499 Cardiac arrhythmia, unspecified: Secondary | ICD-10-CM | POA: Diagnosis not present

## 2021-01-21 DIAGNOSIS — I10 Essential (primary) hypertension: Secondary | ICD-10-CM | POA: Diagnosis not present

## 2021-01-21 DIAGNOSIS — R0602 Shortness of breath: Secondary | ICD-10-CM | POA: Diagnosis not present

## 2021-01-21 DIAGNOSIS — E1165 Type 2 diabetes mellitus with hyperglycemia: Secondary | ICD-10-CM

## 2021-01-21 NOTE — Patient Instructions (Signed)
Medication Instructions:  Your physician recommends that you continue on your current medications as directed. Please refer to the Current Medication list given to you today.   *If you need a refill on your cardiac medications before your next appointment, please call your pharmacy*   Lab Work:  Fasting LIPIDS  If you have labs (blood work) drawn today and your tests are completely normal, you will receive your results only by: MyChart Message (if you have MyChart) OR A paper copy in the mail If you have any lab test that is abnormal or we need to change your treatment, we will call you to review the results.   Testing/Procedures: Your physician has requested that you have an echocardiogram. Echocardiography is a painless test that uses sound waves to create images of your heart. It provides your doctor with information about the size and shape of your heart and how well your heart's chambers and valves are working. This procedure takes approximately one hour. There are no restrictions for this procedure.   Your physician has requested that you have a lexiscan myoview. For further information please visit [email or web address] Please follow instruction sheet, as given.     Your physician has requested that you have an abdominal aorta duplex. During this test, an ultrasound is used to evaluate the aorta. Allow 30 minutes for this exam. Do not eat after midnight the day before and avoid carbonated beverages    Follow-Up: At [facility], you and your health needs are our priority.  As part of our continuing mission to provide you with exceptional heart care, we have created designated Provider Care Teams.  These Care Teams include your primary Cardiologist (physician) and Advanced Practice Providers (APPs -  Physician Assistants and Nurse Practitioners) who all work together to provide you with the care you need, when you need it.  We recommend signing up for the patient portal called  "MyChart".  Sign up information is provided on this After Visit Summary.  MyChart is used to connect with patients for Virtual Visits (Telemedicine).  Patients are able to view lab/test results, encounter notes, upcoming appointments, etc.  Non-urgent messages can be sent to your provider as well.   To learn more about what you can do with MyChart, go to [email or web address].    Your next appointment:   5 month(s)  The format for your next appointment:   In Person  Provider:   Dietrich Pates, MD    Other Instructions None

## 2021-01-21 NOTE — Progress Notes (Signed)
Cardiology Office Note   Date:  01/21/2021   ID:  Adrienne, Delay 01/15/1935, MRN [id number]  PCP:  Mirna Mires, MD  Cardiologist:   Dietrich Pates, MD   Patient presents for evaluation of cardiac risk stratificaiton     History of Present Illness: Sean Stuart is a 86 y.o. male with a history of diabetes, hypertension, dyslipidemia.  He is followed by Dr. Loleta Chance in clinic he was seen in January but missed an appointment.  The patient denies chest pain.  Says his breathing is okay.  He bowls in a senior league.  He plays golf (usually rides).  He does walk some.  He has had some dizziness which she attributed to not eating.  Does say he gets more short of breath with activities in the past.  Diet: Breakfast:   Eggs   Maudry Mayhew Lunch   veggies Dinner    veggies meat  Punch, sun drop soda, water      No outpatient medications have been marked as taking for the 01/21/21 encounter (Office Visit) with Pricilla Riffle, MD.     Allergies:   Patient has no known allergies.   Past Medical History:  Diagnosis Date   Arthritis    knees   Asthma    Diabetes mellitus    Hypertension    Sleep apnea    STOP BANG score 5    Past Surgical History:  Procedure Laterality Date   CATARACT EXTRACTION W/PHACO  06/19/2011   Procedure: CATARACT EXTRACTION PHACO AND INTRAOCULAR LENS PLACEMENT (IOC);  Surgeon: Gemma Payor, MD;  Location: [facility];  Service: Ophthalmology;  Laterality: Left;  CDE: 12.83   CATARACT EXTRACTION W/PHACO  07/07/2011   Procedure: CATARACT EXTRACTION PHACO AND INTRAOCULAR LENS PLACEMENT (IOC);  Surgeon: Gemma Payor, MD;  Location: [facility];  Service: Ophthalmology;  Laterality: Right;  CDE 12.82   LIPOMA EXCISION     right leg surgery     from [facility]     Social History:  The patient  reports that he has quit smoking. He has never used smokeless tobacco. He reports that he does not drink alcohol and does not use drugs.   Family History:  The patient's family  history includes CAD in his father; Diabetes in his mother.    ROS:  Please see the history of present illness. All other systems are reviewed and  Negative to the above problem except as noted.    PHYSICAL EXAM: VS:  BP (!) 142/78   Pulse 60   Ht 6\' 4"  (1.93 m)   Wt 229 lb (103.9 kg)   SpO2 96%   BMI 27.87 kg/m   GEN: Well nourished, well developed, in no acute distress  HEENT: normal  Neck: no JVD, carotid bruits,  Cardiac: RRR; no murmurs,; no lower extremity edema  Respiratory:  clear to auscultation bilaterally, n GI: soft, nontender, nondistended, + BS  No hepatomegaly  MS: no deformity Moving all extremities   Skin: warm and dry, no rash Neuro:  Strength and sensation are intact Psych: euthymic mood, full affect   EKG:  EKG is ordered today.  Normal sinus rhythm.  T wave inversion inferior and anteriorly  CT 12/2015  IMPRESSION: Mild prominence of the proximal aorta with tapering distally. Maximum measured transverse diameter of the aorta is in the more proximal aspect measuring 3.1 x 2.8 cm. Recommend followup by ultrasound in 3 years. This recommendation follows ACR consensus guidelines: White Paper of  the ACR Incidental Findings Committee II on Vascular Findings. J Am Coll Radiol 2013; 10:789-794.   Right renal cyst.   Increased liver echogenicity, a finding indicative of hepatic steatosis. While no focal liver lesions are identified, it must be cautioned that the sensitivity of ultrasound for focal liver lesions is diminished in this circumstance.   Lipid Panel    Component Value Date/Time   CHOL 164 04/10/2016 0853   TRIG 39.0 04/10/2016 0853   HDL 59.30 04/10/2016 0853   CHOLHDL 3 04/10/2016 0853   VLDL 7.8 04/10/2016 0853   LDLCALC 97 04/10/2016 0853      Wt Readings from Last 3 Encounters:  01/21/21 229 lb (103.9 kg)  07/10/16 242 lb (109.8 kg)  07/10/16 242 lb (109.8 kg)      ASSESSMENT AND PLAN:  1.  Cardiac risk stratification.   Patient has evidence of atherosclerosis on his CT from 2017.  He should be on statin.  He is on aspirin.  I am not convinced of active angina.  I would however schedule an echocardiogram given a history of dyspnea with exertion.  With his longstanding diabetes I would also recommend a Lexiscan Myoview.  2 dyslipidemia.  Discussed being on the statin.  Would start Crestor 10 mg follow-up lipids in 8 weeks.  3.  PVD OD.  We will repeat an abdominal ultrasound to reevaluate his aorta  For diet.  Patient has a lot of extra sugar during the day of given him the name of the videos sugar coated and watch sugars as bad as alcohol for the liver.  He needs to stop drinking sweetened drinks.  Again his CT from 2017 shows fatty liver changes which is due to previous alcohol use as well as too much sugar.    I will set to see the patient back in the spring sooner depending on test results. Current medicines are reviewed at length with the patient today.  The patient does not have concerns regarding medicines.  Signed, Dietrich Pates, MD  01/21/2021 3:08 PM    [facility] [address] [place], [place], [place]  [address] Phone: [phone]; Fax: [phone]

## 2021-01-22 ENCOUNTER — Ambulatory Visit: Payer: Medicare PPO | Admitting: Urology

## 2021-01-23 ENCOUNTER — Ambulatory Visit: Payer: Medicare PPO | Admitting: Urology

## 2021-01-23 NOTE — Progress Notes (Deleted)
   Assessment: No diagnosis found.   Plan: ***  Chief Complaint: No chief complaint on file.   History of Present Illness:  Sean Stuart is a 86 y.o. year old male who is seen in consultation from Mirna Mires, MD  for evaluation of ***.   Past Medical History:  Past Medical History:  Diagnosis Date   Arthritis    knees   Asthma    Diabetes mellitus    Hypertension    Sleep apnea    STOP BANG score 5    Past Surgical History:  Past Surgical History:  Procedure Laterality Date   CATARACT EXTRACTION W/PHACO  06/19/2011   Procedure: CATARACT EXTRACTION PHACO AND INTRAOCULAR LENS PLACEMENT (IOC);  Surgeon: Gemma Payor, MD;  Location: [facility];  Service: Ophthalmology;  Laterality: Left;  CDE: 12.83   CATARACT EXTRACTION W/PHACO  07/07/2011   Procedure: CATARACT EXTRACTION PHACO AND INTRAOCULAR LENS PLACEMENT (IOC);  Surgeon: Gemma Payor, MD;  Location: [facility];  Service: Ophthalmology;  Laterality: Right;  CDE 12.82   LIPOMA EXCISION     right leg surgery     from [facility]    Allergies:  No Known Allergies  Family History:  Family History  Problem Relation Age of Onset   Diabetes Mother    CAD Father    Anesthesia problems Neg Hx    Hypotension Neg Hx    Malignant hyperthermia Neg Hx    Pseudochol deficiency Neg Hx     Social History:  Social History   Tobacco Use   Smoking status: Former   Smokeless tobacco: Never  Building services engineer Use: Never used  Substance Use Topics   Alcohol use: No    Comment: quit 1988   Drug use: No    Review of symptoms:  Constitutional:  Negative for unexplained weight loss, night sweats, fever, chills ENT:  Negative for nose bleeds, sinus pain, painful swallowing CV:  Negative for chest pain, shortness of breath, exercise intolerance, palpitations, loss of consciousness Resp:  Negative for cough, wheezing, shortness of breath GI:  Negative for nausea, vomiting, diarrhea, bloody stools GU:  Positives noted in HPI;  otherwise negative for gross hematuria, dysuria, urinary incontinence Neuro:  Negative for seizures, poor balance, limb weakness, slurred speech Psych:  Negative for lack of energy, depression, anxiety Endocrine:  Negative for polydipsia, polyuria, symptoms of hypoglycemia (dizziness, hunger, sweating) Hematologic:  Negative for anemia, purpura, petechia, prolonged or excessive bleeding, use of anticoagulants  Allergic:  Negative for difficulty breathing or choking as a result of exposure to anything; no shellfish allergy; no allergic response (rash/itch) to materials, foods  Physical exam: There were no vitals taken for this visit. GENERAL APPEARANCE:  Well appearing, well developed, well nourished, NAD HEENT: Atraumatic, Normocephalic, oropharynx clear. NECK: Supple without lymphadenopathy or thyromegaly. LUNGS: Clear to auscultation bilaterally. HEART: Regular Rate and Rhythm without murmurs, gallops, or rubs. ABDOMEN: Soft, non-tender, No Masses. EXTREMITIES: Moves all extremities well.  Without clubbing, cyanosis, or edema. NEUROLOGIC:  Alert and oriented x 3, normal gait, CN II-XII grossly intact.  MENTAL STATUS:  Appropriate. BACK:  Non-tender to palpation.  No CVAT SKIN:  Warm, dry and intact.    Results: No results found for this or any previous visit (from the past 24 hour(s)).

## 2021-01-29 ENCOUNTER — Ambulatory Visit (HOSPITAL_COMMUNITY)
Admission: RE | Admit: 2021-01-29 | Discharge: 2021-01-29 | Disposition: A | Discharge status: Home / Self Care | Payer: Medicare PPO | Source: Ambulatory Visit | Attending: Internal Medicine | Admitting: Internal Medicine

## 2021-01-29 ENCOUNTER — Encounter (HOSPITAL_COMMUNITY): Payer: Self-pay

## 2021-01-29 ENCOUNTER — Other Ambulatory Visit: Payer: Self-pay

## 2021-01-29 DIAGNOSIS — R0602 Shortness of breath: Secondary | ICD-10-CM | POA: Insufficient documentation

## 2021-01-29 LAB — NM MYOCAR MULTI W/SPECT W/WALL MOTION / EF
LV dias vol: 136 mL (ref 62–150)
LV sys vol: 79 mL
Nuc Stress EF: 42 %
Peak HR: 82 {beats}/min
RATE: 0.4
Rest HR: 67 {beats}/min
Rest Nuclear Isotope Dose: 9.2 mCi
SDS: 1
SRS: 2
SSS: 3
ST Depression (mm): 0 mm
Stress Nuclear Isotope Dose: 27 mCi
TID: 1.11

## 2021-01-29 MED ORDER — SODIUM CHLORIDE FLUSH 0.9 % IV SOLN
INTRAVENOUS | Status: AC
  Administered 2021-01-29: 10 mL via INTRAVENOUS
  Filled 2021-01-29: qty 10

## 2021-01-29 MED ORDER — TECHNETIUM TC 99M TETROFOSMIN IV KIT
10.0000 | PACK | Freq: Once | INTRAVENOUS | Status: AC | PRN
  Administered 2021-01-29: 9.2 via INTRAVENOUS

## 2021-01-29 MED ORDER — REGADENOSON 0.4 MG/5ML IV SOLN
INTRAVENOUS | Status: AC
  Administered 2021-01-29: 0.4 mg via INTRAVENOUS
  Filled 2021-01-29: qty 5

## 2021-01-29 MED ORDER — TECHNETIUM TC 99M TETROFOSMIN IV KIT
30.0000 | PACK | Freq: Once | INTRAVENOUS | Status: AC | PRN
  Administered 2021-01-29: 27 via INTRAVENOUS

## 2021-02-01 ENCOUNTER — Telehealth: Payer: Self-pay

## 2021-02-01 MED ORDER — ROSUVASTATIN CALCIUM 10 MG PO TABS: 10.0000 mg | ORAL_TABLET | Freq: Every day | ORAL | 3 refills | Status: DC

## 2021-02-01 NOTE — Telephone Encounter (Signed)
-----   Message from Pricilla Riffle, MD sent at 01/29/2021 10:30 PM EST ----- Myoview shows a small area of possible ischemia.   Pumping function of the heart difficult to fully assess    REcomm:   Pt should have echo that is scheduled Should be on ASA SHould be on statin   He is not   Needs lipids and would start Crestor 10 mg empiriclly

## 2021-02-01 NOTE — Telephone Encounter (Signed)
Results discussed with patient.He agrees to start crestor 10 mg daily

## 2021-02-05 ENCOUNTER — Telehealth: Payer: Self-pay

## 2021-02-05 ENCOUNTER — Ambulatory Visit (HOSPITAL_COMMUNITY)
Admission: RE | Admit: 2021-02-05 | Discharge: 2021-02-05 | Disposition: A | Discharge status: Home / Self Care | Payer: Medicare PPO | Source: Ambulatory Visit | Attending: Internal Medicine | Admitting: Internal Medicine

## 2021-02-05 ENCOUNTER — Other Ambulatory Visit: Payer: Self-pay

## 2021-02-05 ENCOUNTER — Other Ambulatory Visit (HOSPITAL_COMMUNITY)
Admission: RE | Admit: 2021-02-05 | Discharge: 2021-02-05 | Disposition: A | Discharge status: Home / Self Care | Payer: Medicare PPO | Source: Ambulatory Visit | Attending: Internal Medicine | Admitting: Internal Medicine

## 2021-02-05 DIAGNOSIS — I714 Abdominal aortic aneurysm, without rupture, unspecified: Secondary | ICD-10-CM | POA: Insufficient documentation

## 2021-02-05 DIAGNOSIS — I77819 Aortic ectasia, unspecified site: Secondary | ICD-10-CM | POA: Diagnosis present

## 2021-02-05 DIAGNOSIS — E1165 Type 2 diabetes mellitus with hyperglycemia: Secondary | ICD-10-CM | POA: Insufficient documentation

## 2021-02-05 LAB — LIPID PANEL
Cholesterol: 153 mg/dL (ref 0–200)
HDL: 51 mg/dL (ref >40)
LDL Cholesterol: 91 mg/dL (ref 0–99)
Total CHOL/HDL Ratio: 3 RATIO
Triglycerides: 56 mg/dL (ref <150)
VLDL: 11 mg/dL (ref 0–40)

## 2021-02-05 NOTE — Telephone Encounter (Signed)
Patient notified and verbalized understanding. Pt had no questions or concerns at this time. Will fwd copy to PCP.

## 2021-02-05 NOTE — Telephone Encounter (Signed)
-----   Message from Macie Burows, RN sent at 02/05/2021  4:50 PM EST -----  ----- Message ----- From: Pricilla Riffle, MD Sent: 02/05/2021   4:29 PM EST To: Bertram Millard, RN  Very mild dilation of aorta  Recomm folllow up in 2 years

## 2021-02-12 DIAGNOSIS — F1010 Alcohol abuse, uncomplicated: Secondary | ICD-10-CM | POA: Diagnosis not present

## 2021-02-12 DIAGNOSIS — E1169 Type 2 diabetes mellitus with other specified complication: Secondary | ICD-10-CM | POA: Diagnosis not present

## 2021-02-12 DIAGNOSIS — I10 Essential (primary) hypertension: Secondary | ICD-10-CM | POA: Diagnosis not present

## 2021-02-12 DIAGNOSIS — R972 Elevated prostate specific antigen [PSA]: Secondary | ICD-10-CM | POA: Diagnosis not present

## 2021-02-12 DIAGNOSIS — M545 Low back pain, unspecified: Secondary | ICD-10-CM | POA: Diagnosis not present

## 2021-02-28 DIAGNOSIS — R972 Elevated prostate specific antigen [PSA]: Secondary | ICD-10-CM | POA: Diagnosis not present

## 2021-03-05 ENCOUNTER — Ambulatory Visit (HOSPITAL_COMMUNITY)
Admission: RE | Admit: 2021-03-05 | Discharge: 2021-03-05 | Disposition: A | Discharge status: Home / Self Care | Payer: Medicare PPO | Source: Ambulatory Visit | Attending: Family Medicine | Admitting: Family Medicine

## 2021-03-05 ENCOUNTER — Other Ambulatory Visit: Payer: Self-pay

## 2021-03-05 DIAGNOSIS — R0602 Shortness of breath: Secondary | ICD-10-CM | POA: Diagnosis not present

## 2021-03-05 LAB — ECHOCARDIOGRAM COMPLETE
Area-P 1/2: 3.03 cm2
P 1/2 time: 785 msec
S' Lateral: 3.3 cm

## 2021-03-05 NOTE — Progress Notes (Signed)
*  PRELIMINARY RESULTS* Echocardiogram 2D Echocardiogram has been performed.  Stacey Drain 03/05/2021, 4:22 PM

## 2021-03-06 ENCOUNTER — Telehealth: Payer: Self-pay

## 2021-03-06 DIAGNOSIS — E663 Overweight: Secondary | ICD-10-CM | POA: Diagnosis not present

## 2021-03-06 DIAGNOSIS — E1142 Type 2 diabetes mellitus with diabetic polyneuropathy: Secondary | ICD-10-CM | POA: Diagnosis not present

## 2021-03-06 DIAGNOSIS — R0602 Shortness of breath: Secondary | ICD-10-CM

## 2021-03-06 DIAGNOSIS — M545 Low back pain, unspecified: Secondary | ICD-10-CM | POA: Diagnosis not present

## 2021-03-06 DIAGNOSIS — M1990 Unspecified osteoarthritis, unspecified site: Secondary | ICD-10-CM | POA: Diagnosis not present

## 2021-03-06 DIAGNOSIS — E785 Hyperlipidemia, unspecified: Secondary | ICD-10-CM | POA: Diagnosis not present

## 2021-03-06 DIAGNOSIS — I10 Essential (primary) hypertension: Secondary | ICD-10-CM | POA: Diagnosis not present

## 2021-03-06 DIAGNOSIS — R32 Unspecified urinary incontinence: Secondary | ICD-10-CM | POA: Diagnosis not present

## 2021-03-06 DIAGNOSIS — K5900 Constipation, unspecified: Secondary | ICD-10-CM | POA: Diagnosis not present

## 2021-03-06 DIAGNOSIS — G8929 Other chronic pain: Secondary | ICD-10-CM | POA: Diagnosis not present

## 2021-03-06 NOTE — Telephone Encounter (Signed)
Orders entered for Limited Echo with Definity to assess LV function per Dr. Tenny Craw.

## 2021-03-06 NOTE — Telephone Encounter (Signed)
-----   Message from Pricilla Riffle, MD sent at 03/05/2021 11:07 PM EST ----- Would get limited echo with Definity to confirm LV function

## 2021-03-08 ENCOUNTER — Telehealth: Payer: Self-pay

## 2021-03-08 NOTE — Telephone Encounter (Signed)
°  Dianne returned call

## 2021-03-08 NOTE — Telephone Encounter (Signed)
-----   Message from Pricilla Riffle, MD sent at 03/05/2021 11:07 PM EST ----- Would get limited echo with Definity to confirm LV function

## 2021-03-08 NOTE — Telephone Encounter (Signed)
Spoke to pt's niece who verbalized agreement/understanding of pt appt date & time.

## 2021-03-08 NOTE — Telephone Encounter (Signed)
No answer, no voicemail for patient number. Left message with niece to return call. Apt made for limited echo with definity contrast on 04/15/21 at 1 pm-arrive to [facility] main entrance for check in at 1245 pm.

## 2021-04-15 ENCOUNTER — Ambulatory Visit (HOSPITAL_COMMUNITY): Admission: RE | Admit: 2021-04-15 | Payer: Medicare Other | Source: Ambulatory Visit

## 2021-05-29 ENCOUNTER — Ambulatory Visit (HOSPITAL_COMMUNITY)
Admission: RE | Admit: 2021-05-29 | Discharge: 2021-05-29 | Disposition: A | Discharge status: Home / Self Care | Payer: Medicare Other | Source: Ambulatory Visit | Attending: Internal Medicine | Admitting: Internal Medicine

## 2021-05-29 DIAGNOSIS — R0602 Shortness of breath: Secondary | ICD-10-CM | POA: Diagnosis present

## 2021-05-29 LAB — ECHOCARDIOGRAM LIMITED: S' Lateral: 3.7 cm

## 2021-05-29 MED ORDER — PERFLUTREN LIPID MICROSPHERE
1.0000 mL | INTRAVENOUS | Status: AC | PRN
  Administered 2021-05-29: 5 mL via INTRAVENOUS

## 2021-05-29 NOTE — Progress Notes (Signed)
*  PRELIMINARY RESULTS* ?Echocardiogram ?Limited 2D Echocardiogram has been performed. ? ?Carolyne Fiscal ?05/29/2021, 3:18 PM ?

## 2021-06-05 ENCOUNTER — Ambulatory Visit: Payer: Medicare PPO | Admitting: Internal Medicine

## 2021-09-10 ENCOUNTER — Ambulatory Visit: Payer: Medicare PPO | Admitting: Internal Medicine

## 2022-01-19 IMAGING — MR MR LUMBAR SPINE W/O CM
5 series · 31 of 48 positions shown · non-contrast
Comparison: None.

CLINICAL DATA: Low back pain radiating down both legs

EXAM:
MRI LUMBAR SPINE WITHOUT CONTRAST
TECHNIQUE: Multiplanar, multisequence MR imaging of the lumbar spine was
performed. No intravenous contrast was administered.

[Series 5: T2 · sagittal · 4.0mm · 0.68mm/px · 6 of 15 slices shown (1 of 2)]
[im 1/15]
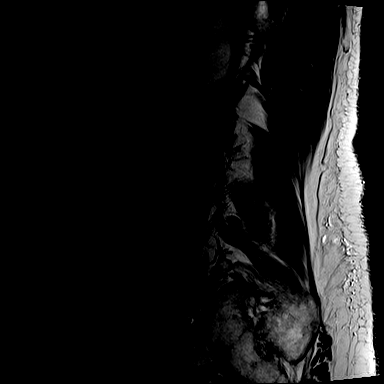
[im 3/15]
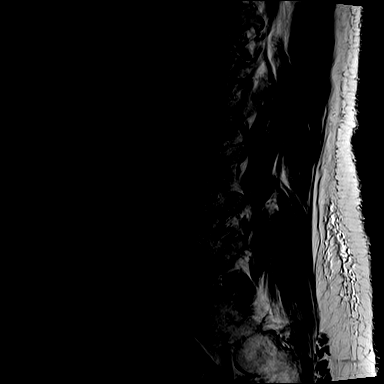
[im 6/15]
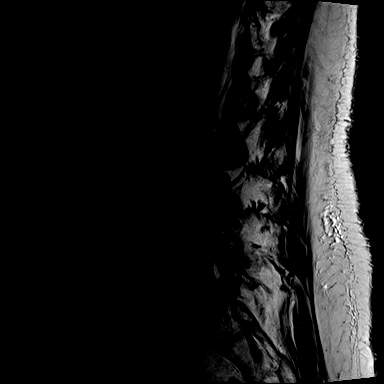
[im 9/15]
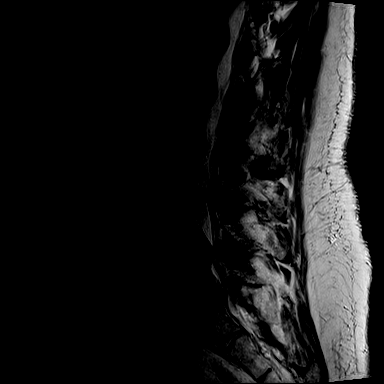
[im 12/15]
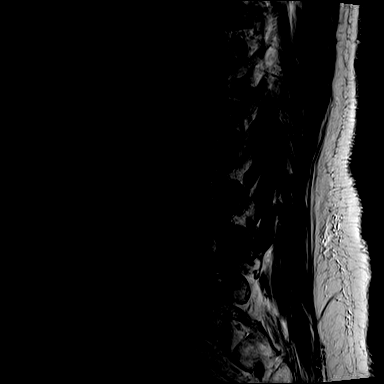
[im 15/15]
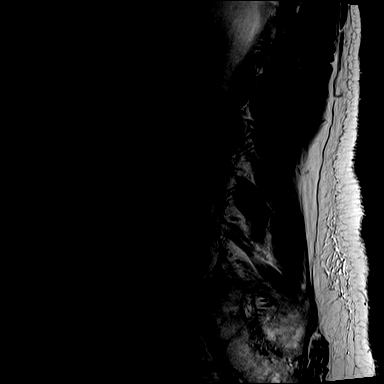

[Series 6: T1 · sagittal · 4.0mm · 0.81mm/px · 7 of 15 slices shown (1 of 2)]
[im 1/15]
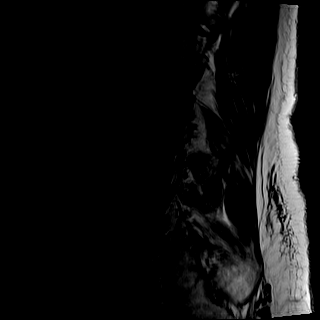
[im 3/15]
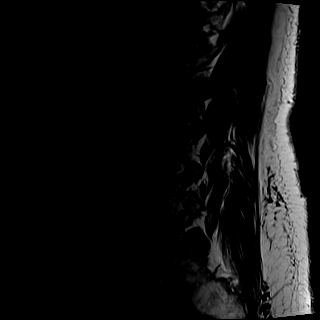
[im 5/15]
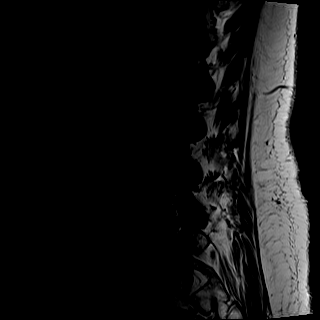
[im 8/15]
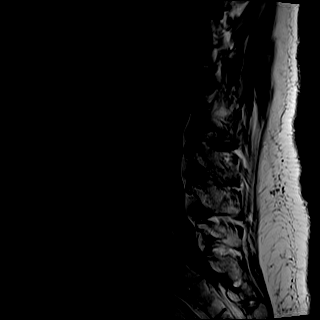
[im 10/15]
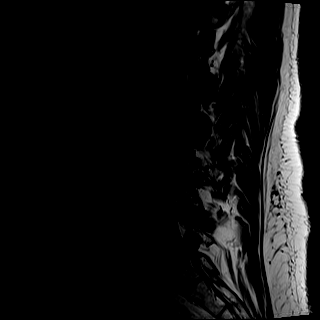
[im 12/15]
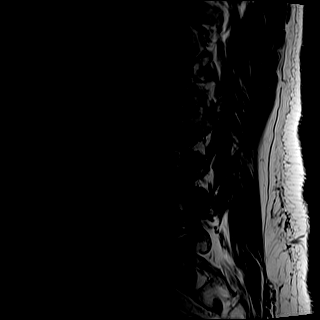
[im 15/15]
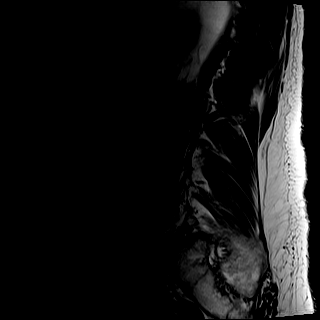

[Series 7: STIR · sagittal · 4.0mm · 0.51mm/px · 2 of 15 slices shown]
[im 1/15]
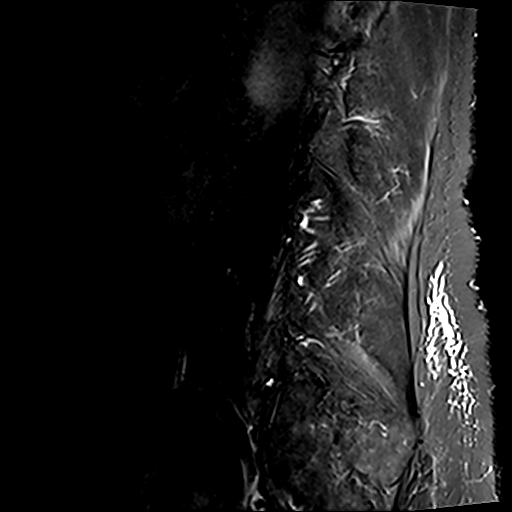
[im 3/15]
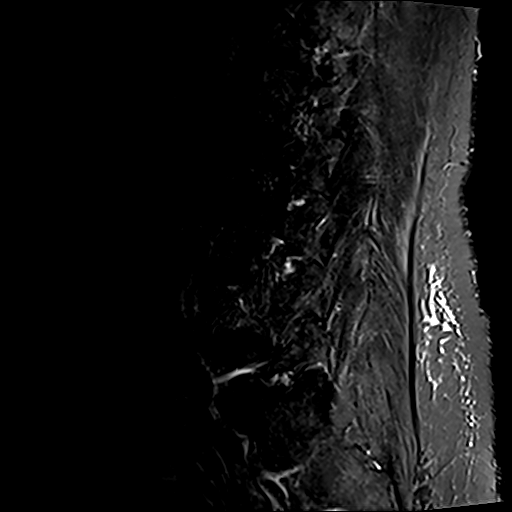

[Series 8: T2 · axial · 4.0mm · 0.70mm/px · z∈[-70,+121]mm · 8 of 31 slices shown (2 of 2)]
[im 1/31]
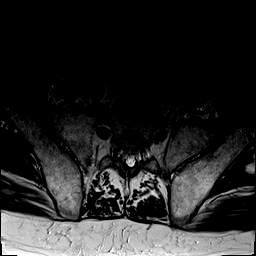
[im 5/31]
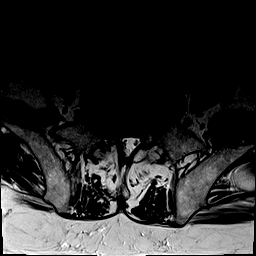
[im 10/31]
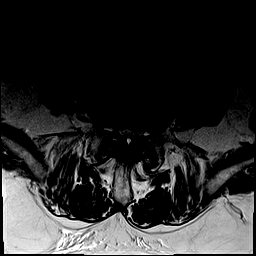
[im 14/31]
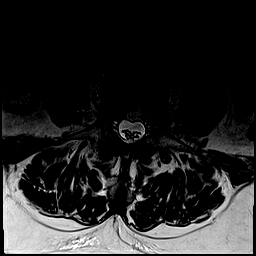
[im 17/31]
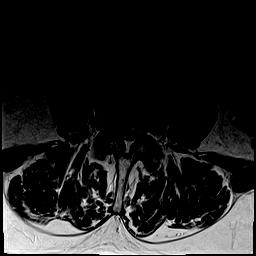
[im 21/31]
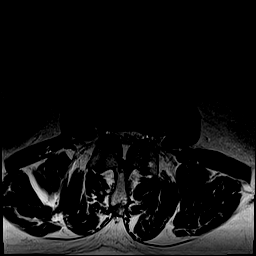
[im 26/31]
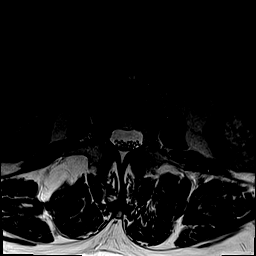
[im 31/31]
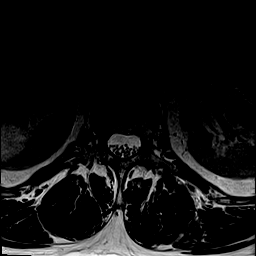

[Series 9: T1 · axial · 4.0mm · 0.35mm/px · z∈[-70,+121]mm · 8 of 31 slices shown (2 of 2)]
[im 1/31]
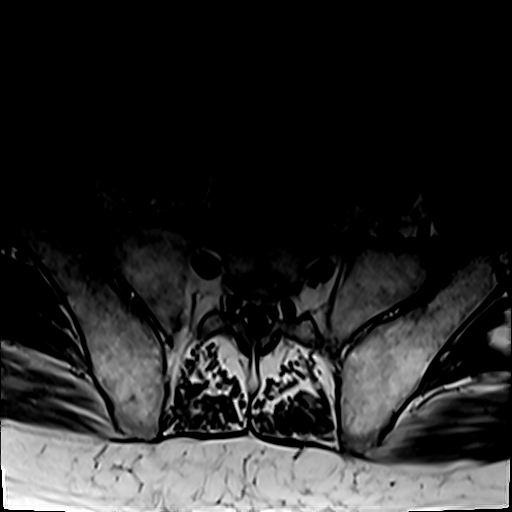
[im 5/31]
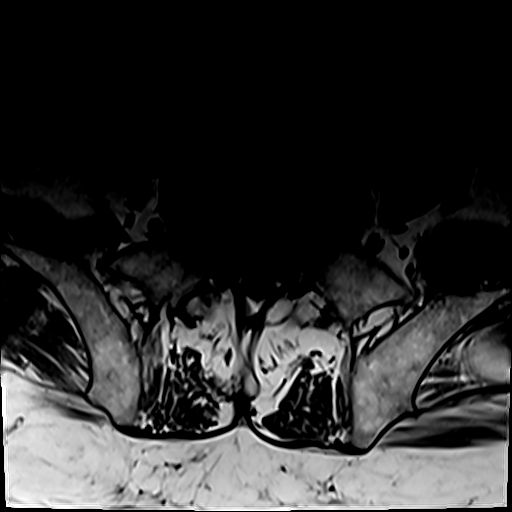
[im 10/31]
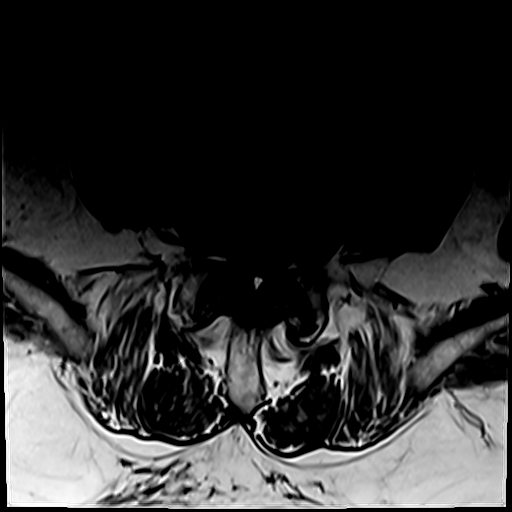
[im 14/31]
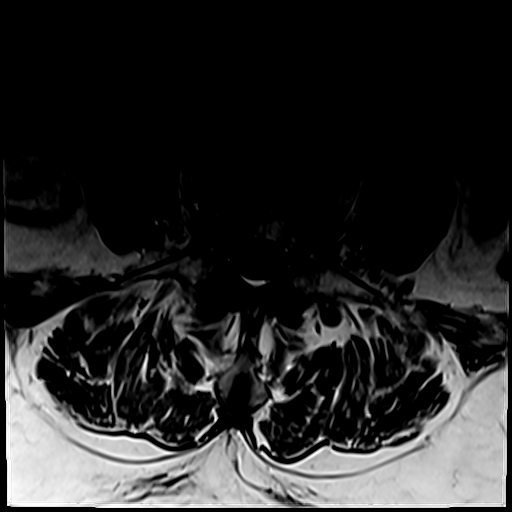
[im 17/31]
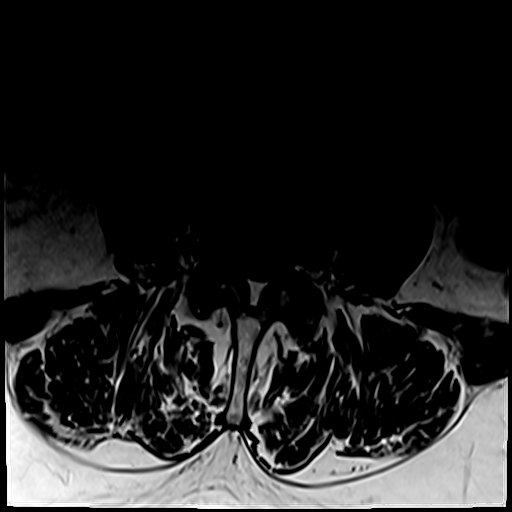
[im 21/31]
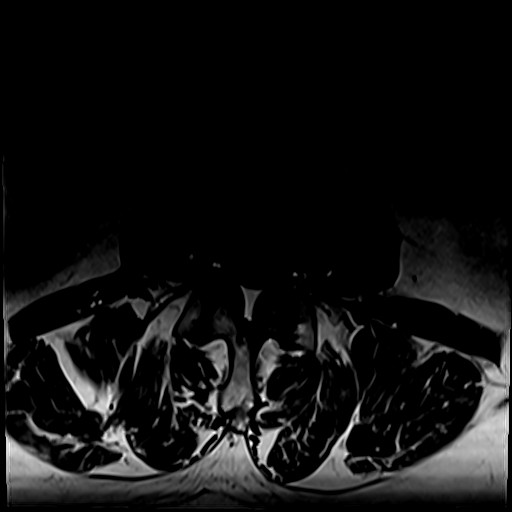
[im 26/31]
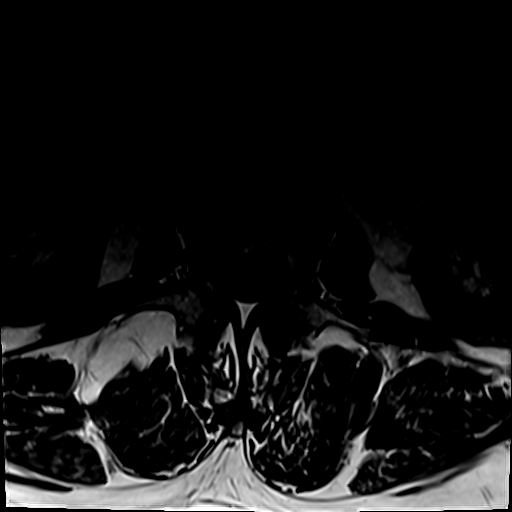
[im 31/31]
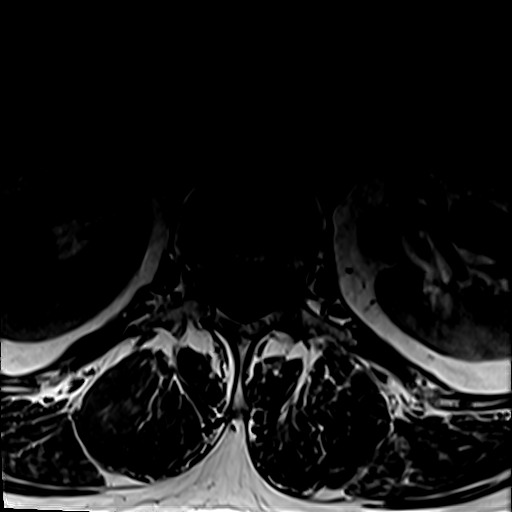

[31 of 48 positions shown; findings below may reference images not displayed]

FINDINGS: Segmentation:  Standard.

Alignment:  Mild retrolisthesis at L5-S1.

Vertebrae: Vertebral body heights are maintained apart from
degenerative endplate irregularity at L5-S1. No marrow edema. No
suspicious osseous lesion.

Conus medullaris and cauda equina: Conus extends to the L1 level.
Conus and cauda equina appear normal.

Paraspinal and other soft tissues: Unremarkable.

Disc levels:

L1-L2:  No canal or foraminal stenosis.

L2-L3: Disc bulge. Facet arthropathy with ligamentum flavum
infolding. Mild canal stenosis. No foraminal stenosis.

L3-L4: Disc bulge with superimposed right subarticular protrusion.
Facet arthropathy with ligamentum flavum infolding. Mild to moderate
canal stenosis. Effacement of the right subarticular recess with
compression of traversing L4 nerve root. Moderate right foraminal
stenosis. Minor left foraminal stenosis.

L4-L5: Disc bulge. Facet arthropathy with ligamentum flavum
infolding. Moderate canal stenosis. Narrowing of the subarticular
recesses. Mild foraminal stenosis.

L5-S1: Disc bulge with endplate osteophytic ridging. Facet
arthropathy with ligamentum flavum infolding. Minor canal stenosis.
Effacement of subarticular recesses. Mild to moderate foraminal
stenosis. Disc/osteophyte abuts the exiting left L5 nerve root.
IMPRESSION: Multilevel degenerative changes as detailed above. Canal stenosis is
greatest at L4-L5. Disc protrusion at L3-L4 compresses the
traversing L4 nerve root. Disc/osteophyte abuts the exiting left L5
nerve root at L5-S1 with potential compression.

## 2022-04-22 DIAGNOSIS — E1169 Type 2 diabetes mellitus with other specified complication: Secondary | ICD-10-CM | POA: Diagnosis not present

## 2022-04-22 DIAGNOSIS — I10 Essential (primary) hypertension: Secondary | ICD-10-CM | POA: Diagnosis not present

## 2022-04-22 DIAGNOSIS — E785 Hyperlipidemia, unspecified: Secondary | ICD-10-CM | POA: Diagnosis not present

## 2022-07-22 DIAGNOSIS — E7800 Pure hypercholesterolemia, unspecified: Secondary | ICD-10-CM | POA: Diagnosis not present

## 2022-07-22 DIAGNOSIS — I10 Essential (primary) hypertension: Secondary | ICD-10-CM | POA: Diagnosis not present

## 2022-07-22 DIAGNOSIS — E1169 Type 2 diabetes mellitus with other specified complication: Secondary | ICD-10-CM | POA: Diagnosis not present

## 2022-07-22 DIAGNOSIS — E785 Hyperlipidemia, unspecified: Secondary | ICD-10-CM | POA: Diagnosis not present

## 2022-08-07 DIAGNOSIS — H401132 Primary open-angle glaucoma, bilateral, moderate stage: Secondary | ICD-10-CM | POA: Diagnosis not present

## 2022-10-21 DIAGNOSIS — I10 Essential (primary) hypertension: Secondary | ICD-10-CM | POA: Diagnosis not present

## 2022-10-21 DIAGNOSIS — E785 Hyperlipidemia, unspecified: Secondary | ICD-10-CM | POA: Diagnosis not present

## 2022-10-21 DIAGNOSIS — E7800 Pure hypercholesterolemia, unspecified: Secondary | ICD-10-CM | POA: Diagnosis not present

## 2022-10-21 DIAGNOSIS — E1169 Type 2 diabetes mellitus with other specified complication: Secondary | ICD-10-CM | POA: Diagnosis not present

## 2022-11-08 DIAGNOSIS — J069 Acute upper respiratory infection, unspecified: Secondary | ICD-10-CM | POA: Diagnosis not present

## 2022-11-08 DIAGNOSIS — R509 Fever, unspecified: Secondary | ICD-10-CM | POA: Diagnosis not present

## 2022-11-08 DIAGNOSIS — I499 Cardiac arrhythmia, unspecified: Secondary | ICD-10-CM | POA: Diagnosis not present

## 2023-01-05 DIAGNOSIS — L308 Other specified dermatitis: Secondary | ICD-10-CM | POA: Diagnosis not present

## 2023-02-05 DIAGNOSIS — E119 Type 2 diabetes mellitus without complications: Secondary | ICD-10-CM | POA: Diagnosis not present

## 2023-02-05 DIAGNOSIS — Z961 Presence of intraocular lens: Secondary | ICD-10-CM | POA: Diagnosis not present

## 2023-02-05 DIAGNOSIS — H401232 Low-tension glaucoma, bilateral, moderate stage: Secondary | ICD-10-CM | POA: Diagnosis not present

## 2023-02-24 DIAGNOSIS — I10 Essential (primary) hypertension: Secondary | ICD-10-CM | POA: Diagnosis not present

## 2023-02-24 DIAGNOSIS — B353 Tinea pedis: Secondary | ICD-10-CM | POA: Diagnosis not present

## 2023-02-24 DIAGNOSIS — E1169 Type 2 diabetes mellitus with other specified complication: Secondary | ICD-10-CM | POA: Diagnosis not present

## 2023-02-24 DIAGNOSIS — E7800 Pure hypercholesterolemia, unspecified: Secondary | ICD-10-CM | POA: Diagnosis not present

## 2023-05-19 DIAGNOSIS — J309 Allergic rhinitis, unspecified: Secondary | ICD-10-CM | POA: Diagnosis not present

## 2023-05-19 DIAGNOSIS — J45909 Unspecified asthma, uncomplicated: Secondary | ICD-10-CM | POA: Diagnosis not present

## 2023-05-19 DIAGNOSIS — R059 Cough, unspecified: Secondary | ICD-10-CM | POA: Diagnosis not present

## 2023-07-02 ENCOUNTER — Ambulatory Visit (HOSPITAL_BASED_OUTPATIENT_CLINIC_OR_DEPARTMENT_OTHER): Admitting: Student

## 2023-07-02 ENCOUNTER — Encounter (HOSPITAL_BASED_OUTPATIENT_CLINIC_OR_DEPARTMENT_OTHER): Payer: Self-pay | Admitting: Student

## 2023-07-02 ENCOUNTER — Ambulatory Visit (HOSPITAL_BASED_OUTPATIENT_CLINIC_OR_DEPARTMENT_OTHER)

## 2023-07-02 DIAGNOSIS — M25561 Pain in right knee: Secondary | ICD-10-CM

## 2023-07-02 DIAGNOSIS — M1711 Unilateral primary osteoarthritis, right knee: Secondary | ICD-10-CM | POA: Diagnosis not present

## 2023-07-02 DIAGNOSIS — G8929 Other chronic pain: Secondary | ICD-10-CM

## 2023-07-02 NOTE — Progress Notes (Signed)
 Chief Complaint: Right knee pain    Discussed the use of AI scribe software for clinical note transcription with the patient, who gave verbal consent to proceed.  History of Present Illness Sean Stuart, a long-time truck driver, presents with right knee pain that has been worsening over the past three to four months. The pain is located in the back of the knee and is inhibiting his ability to bend it.  Pain is made worse with weightbearing.  He has been self-treating with various topical applications, but these have provided no relief. The pain has been impacting his ability to participate in his hobbies, including golf and bowling. He has no prior history of treatment for this knee. He also reports a popping sound when walking, particularly noticeable when golfing. He denies any known injuries to the leg.   Surgical History:   None  PMH/PSH/Family History/Social History/Meds/Allergies:    Past Medical History:  Diagnosis Date   Arthritis    knees   Asthma    Diabetes mellitus    Hypertension    Sleep apnea    STOP BANG score 5   Past Surgical History:  Procedure Laterality Date   CATARACT EXTRACTION W/PHACO  06/19/2011   Procedure: CATARACT EXTRACTION PHACO AND INTRAOCULAR LENS PLACEMENT (IOC);  Surgeon: Anner Kill, MD;  Location: [facility];  Service: Ophthalmology;  Laterality: Left;  CDE: 12.83   CATARACT EXTRACTION W/PHACO  07/07/2011   Procedure: CATARACT EXTRACTION PHACO AND INTRAOCULAR LENS PLACEMENT (IOC);  Surgeon: Anner Kill, MD;  Location: [facility];  Service: Ophthalmology;  Laterality: Right;  CDE 12.82   LIPOMA EXCISION     right leg surgery     from [facility]   Social History   Socioeconomic History   Marital status: Divorced    Spouse name: Not on file   Number of children: Not on file   Years of education: Not on file   Highest education level: Not on file  Occupational History   Not on file  Tobacco Use   Smoking status: Former    Smokeless tobacco: Never  Vaping Use   Vaping status: Never Used  Substance and Sexual Activity   Alcohol use: No    Comment: quit 1988   Drug use: No   Sexual activity: Not on file  Other Topics Concern   Not on file  Social History Narrative   Not on file   Social Drivers of Health   Financial Resource Strain: Not on file  Food Insecurity: Not on file  Transportation Needs: Not on file  Physical Activity: Not on file  Stress: Not on file  Social Connections: Not on file   Family History  Problem Relation Age of Onset   Diabetes Mother    CAD Father    Anesthesia problems Neg Hx    Hypotension Neg Hx    Malignant hyperthermia Neg Hx    Pseudochol deficiency Neg Hx    No Known Allergies Current Outpatient Medications  Medication Sig Dispense Refill   amLODipine-atorvastatin (CADUET) 5-40 MG tablet Take 1 tablet by mouth daily.     aspirin EC 81 MG tablet Take 81 mg by mouth every morning.     benzonatate  (TESSALON  PERLES) 100 MG capsule Take 2 capsules (200 mg total) by mouth 3 (three) times daily as needed for cough.  30 capsule 0   bimatoprost (LUMIGAN) 0.01 % SOLN Place 1 drop into both eyes every morning.     glimepiride  (AMARYL ) 4 MG tablet TAKE 1 TABLET BY MOUTH ONCE DAILY BEFORE  BREAKFAST 30 tablet 2   glucose blood (ONETOUCH VERIO) test strip Use to check blood sugar 1 times per day. 50 each 2   pioglitazone  (ACTOS ) 15 MG tablet TAKE 1 TABLET BY MOUTH ONCE DAILY IN THE MORNING 90 tablet 2   potassium chloride  (K-DUR) 10 MEQ tablet TAKE 1 TABLET BY MOUTH ONCE DAILY 15 tablet 0   rosuvastatin  (CRESTOR ) 10 MG tablet Take 1 tablet (10 mg total) by mouth daily after supper. 90 tablet 3   spironolactone (ALDACTONE) 25 MG tablet Take 25 mg by mouth every morning.     No current facility-administered medications for this visit.   No results found.  Review of Systems:   A ROS was performed including pertinent positives and negatives as documented in the  HPI.  Physical Exam :   Constitutional: NAD and appears stated age Neurological: Alert and oriented Psych: Appropriate affect and cooperative There were no vitals taken for this visit.   Comprehensive Musculoskeletal Exam:    Exam of the right knee demonstrates active range of motion from 5 to 100 degrees with palpable crepitus.  Mild effusion present.  Tenderness in the posterior knee without any medial or lateral joint line tenderness.  No laxity with varus or valgus stress.  Negative for overlying erythema or warmth.  Imaging:   Xray (right knee 4 views): Tricompartmental osteoarthritis that is bone-on-bone in the medial compartment.  Diffuse osteophytes in all 3 compartments.   I personally reviewed and interpreted the radiographs.      Assessment & Plan Right knee osteoarthritis   Advanced osteoarthritis with bone-on-bone contact in the medial compartment is causing pain and inflammation. Symptom relief is achievable through injections. Offered to administer a cortisone injection into the right knee joint today which patient is agreeable to. Injection performed in clinic and he tolerated this well.  Monitor relief and consider alternative injections if cortisone is insufficient.      Procedure Note  Patient: Sean Stuart             Date of Birth: [birth year]           MRN: [id number]             Visit Date: 07/02/2023  Procedures: Visit Diagnoses:  1. Unilateral primary osteoarthritis, right knee     Large Joint Inj: R knee on 07/02/2023 6:01 PM Indications: pain Details: 22 G 1.5 in needle, anterolateral approach Medications: 4 mL lidocaine  1 %; 2 mL triamcinolone  acetonide 40 MG/ML Outcome: tolerated well, no immediate complications Procedure, treatment alternatives, risks and benefits explained, specific risks discussed. Consent was given by the patient. Immediately prior to procedure a time out was called to verify the correct patient, procedure, equipment,  support staff and site/side marked as required. Patient was prepped and draped in the usual sterile fashion.       I personally saw and evaluated the patient, and participated in the management and treatment plan.  Sharrell Deck, PA-C Orthopedics

## 2023-07-03 MED ORDER — LIDOCAINE HCL 1 % IJ SOLN
4.0000 mL | INTRAMUSCULAR | Status: AC | PRN
  Administered 2023-07-02: 4 mL

## 2023-07-03 MED ORDER — TRIAMCINOLONE ACETONIDE 40 MG/ML IJ SUSP
2.0000 mL | INTRAMUSCULAR | Status: AC | PRN
  Administered 2023-07-02: 2 mL via INTRA_ARTICULAR

## 2023-07-07 DIAGNOSIS — B353 Tinea pedis: Secondary | ICD-10-CM | POA: Diagnosis not present

## 2023-07-07 DIAGNOSIS — E1165 Type 2 diabetes mellitus with hyperglycemia: Secondary | ICD-10-CM | POA: Diagnosis not present

## 2023-07-07 DIAGNOSIS — I10 Essential (primary) hypertension: Secondary | ICD-10-CM | POA: Diagnosis not present

## 2023-07-07 DIAGNOSIS — E7800 Pure hypercholesterolemia, unspecified: Secondary | ICD-10-CM | POA: Diagnosis not present

## 2023-07-10 ENCOUNTER — Other Ambulatory Visit: Payer: Self-pay

## 2023-07-10 NOTE — Progress Notes (Signed)
 07/10/2023 Name: Sean Stuart MRN: [id number] DOB: 1934/07/09  Chief Complaint  Patient presents with   Medication Management   Diabetes   Hyperlipidemia   Hypertension    DEZMEN ALCOCK is a 89 y.o. year old male who presented for a telephone visit.   They were referred to the pharmacist by their PCP for assistance in managing diabetes, hypertension, hyperlipidemia, and medication access.    Subjective: Patient referred to pharmacist for medication management and medication assistance for Lumigan. Patient very aware of medications and medical conditions.   Care Team: Primary Care Provider: Wilburn Handler, MD ; Next Scheduled Visit: 10/06/2023   Medication Access/Adherence  Current Pharmacy:  [facility] [address], [place] - [address] [place] [address] Phone: [phone] Fax: [phone]   Patient reports affordability concerns with their medications: No  Patient reports access/transportation concerns to their pharmacy: No  Patient reports adherence concerns with their medications:  No     Diabetes:  Current medications: pioglitazone  15 mg daily, glimepiride  4 mg daily.   Current glucose readings: FBGs: 120s - 140 Using Accu-chek Aviva meter; testing twice times daily - before breakfast and before supper   Patient denies hypoglycemic s/sx including dizziness, shakiness, sweating. Patient denies hyperglycemic symptoms including polyuria, polydipsia, polyphagia, nocturia, neuropathy, blurred vision.  Current meal patterns:  - Breakfast: 1 fried egg, 1 slice of white bread, sometime bacon, hot tea with sugar - Lunch typically a sandwich, green beans/peas, today: fried chicken legs (2), peach cobbler - Supper typically protein, carb, and veggie - Snacks nabs, oatmeal cookies, tangerines - Drinks sweet tea, sometimes gatorade and soda (both regular), water 1-3 bottles daily  Current physical activity: golfing - twice  weekly, 18 holes, walks to holes, bowling on Mondays seasonally    Hypertension:  Current medications: amlodipine-benazepril 5-40 mg daily    Patient has a validated, automated, upper arm home BP cuff Current blood pressure readings readings: 129/64 at last OV on 07/07/23, BP well controlled at prior OVs as well.  Patient denies hypotensive s/sx including dizziness, lightheadedness.  Patient denies hypertensive symptoms including headache, chest pain, shortness of breath    Hyperlipidemia/ASCVD Risk Reduction  Current lipid lowering medications: atorvastatin 10 mg daily  Medications tried in the past: rosuvastatin  10 mg   Antiplatelet regimen: Aspirin 81 mg daily    Medication Management:  Current adherence strategy: patient gets 90-day supply fills of medications to inc  Patient reports Good adherence to medications  Patient reports the following barriers to adherence: financial barriers to Lumigan - I will help the patient with Lumigan's patient assistance program (PAP).  Recent fill dates:  Atorvastatin 10 mg - last filled 06/02/23 for 90-day supply Amlodipine-Benazepril 5-40 mg - last filled 06/02/23 for 90-day supply  Glimepiride  4 mg - last filled 06/02/23 for 30-day supply Pioglitazone  15 mg - last filled 04/26/23 for 90-day supply   Current medication access support: Lumigan - will look into patient assistance programs  Objective:  Medications Reviewed Today     Reviewed by Livia Riffle, [facility] (Pharmacist) on 07/10/23 at 1404  Med List Status: <None>   Medication Order Taking? Sig Documenting Provider Last Dose Status Informant  amLODipine-benazepril (LOTREL) 5-40 MG capsule [id number] Yes Take 1 capsule by mouth daily. [provider] Taking Active   aspirin EC 81 MG tablet [id number] Yes Take 81 mg by mouth every morning. [provider] Taking Active Self  atorvastatin (LIPITOR) 10 MG tablet [id number] Yes Take  10 mg by mouth daily. [provider] Taking Active   bimatoprost (LUMIGAN) 0.01 % SOLN [id number] Yes Place 1 drop into both eyes every morning. [provider] Taking Active Pharmacy Records           Med Note (FIELDS, ABIGAIL M   Tue Jun 17, 2011  9:29 AM) Patient uses in the morning not the evening  clotrimazole-betamethasone (LOTRISONE) cream [id number] Yes Apply topically at bedtime. [provider] Taking Active   glimepiride  (AMARYL ) 4 MG tablet [id number] Yes TAKE 1 TABLET BY MOUTH ONCE DAILY BEFORE  Scharlene Curry, MD Taking Active   glucose blood (ONETOUCH VERIO) test strip [id number] Yes Use to check blood sugar 1 times per day. Lajean Pike, MD Taking Active            Med Note Tobey Forte, [facility] D   Fri Jul 10, 2023  2:00 PM) Pt has Accu-chek Avia meter and strips  montelukast (SINGULAIR) 10 MG tablet [id number] Yes Take 10 mg by mouth daily. [provider] Taking Active   pioglitazone  (ACTOS ) 15 MG tablet [id number] Yes TAKE 1 TABLET BY MOUTH ONCE DAILY IN THE Starla Easterly, MD Taking Active   spironolactone (ALDACTONE) 25 MG tablet [id number] Yes Take 25 mg by mouth every morning. [provider] Taking Active             Assessment/Plan:   Diabetes: - Currently controlled, A1c 7.8%, was 6.6% in Dec 2024 - Reviewed long term cardiovascular and renal outcomes of uncontrolled blood sugar - Reviewed goal A1c, goal fasting, and goal 2 hour post prandial glucose - Reviewed dietary modifications including reducing sugary drinks and replacing with sugar-free substitutions, and choosing healthier dessert and snack options such as nuts and fruits - Recommend to continue current regimen at this time, discussed adjusting regimen, patient would like to incorporate dietary modifications and revisit  - Recommend to check glucose twice daily, fasting and 2-hrs post-prandial (patient prefers after dinner)   Hypertension: - Currently controlled - Reviewed dietary  recommendations including reducing sodium intake - Recommend to continue current regimen    Hyperlipidemia/ASCVD Risk Reduction: - Currently controlled, TC 116 mg/dL, LDL 50 mg/dL, TG 62 mg/dL, HDL 52 mg/dL - Reviewed long term complications of uncontrolled cholesterol - Reviewed dietary recommendations including reducing fried fatty foods  - Recommend to continue current regimen      Medication Management: - Currently strategy sufficient to maintain appropriate adherence to prescribed medication regimen - Collaborated with pharmacy technician and provider to get Lumigan through PAP, patient was provided samples by PCP    Follow Up Plan: Will follow-up with patient in 4 weeks.   Livia Riffle, PharmD Clinical Pharmacist  [phone]

## 2023-07-14 ENCOUNTER — Telehealth: Payer: Self-pay

## 2023-07-14 ENCOUNTER — Other Ambulatory Visit (HOSPITAL_COMMUNITY): Payer: Self-pay

## 2023-07-14 NOTE — Telephone Encounter (Signed)
 PAP: Patient assistance application for Lumigan through [employer]) has been mailed to pt's home address on file. Provider portion of application will be faxed to provider's office.   Provider portion has been faxed to Dr. Wilburn Handler and [facility] for review

## 2023-08-03 NOTE — Telephone Encounter (Signed)
 Pt. Received application in the mail.  Stated he is taking to [facility] to have them help him fill it out, and will get back ASAP.

## 2023-08-06 DIAGNOSIS — H401232 Low-tension glaucoma, bilateral, moderate stage: Secondary | ICD-10-CM | POA: Diagnosis not present

## 2023-09-17 ENCOUNTER — Telehealth: Payer: Self-pay

## 2023-09-17 NOTE — Progress Notes (Signed)
   09/17/2023  Patient ID: Sean Stuart, male   DOB: 12-29-1934, 89 y.o.   MRN: [id number]  Contacted patient regarding referral for diabetes from Leigh Lung, MD .   Appointment scheduled on 09/23/23.  Heather Factor, PharmD Clinical Pharmacist  [phone]

## 2023-09-23 NOTE — Progress Notes (Signed)
   09/23/2023  Patient ID: Carlin LELON Matt, male   DOB: April 02, 1934, 89 y.o.   MRN: [id number]  Contacted patient regarding referral for diabetes from Leigh Lung, MD .   Appointment scheduled on 09/23/23.Was unable to reach patient, left HIPAA compliant voicemail.  Heather Factor, PharmD Clinical Pharmacist  [phone]

## 2023-10-01 ENCOUNTER — Encounter (HOSPITAL_BASED_OUTPATIENT_CLINIC_OR_DEPARTMENT_OTHER): Payer: Self-pay

## 2023-10-01 ENCOUNTER — Encounter: Payer: Self-pay | Admitting: Physician Assistant

## 2023-10-01 ENCOUNTER — Ambulatory Visit: Admitting: Physician Assistant

## 2023-10-01 ENCOUNTER — Ambulatory Visit (HOSPITAL_BASED_OUTPATIENT_CLINIC_OR_DEPARTMENT_OTHER): Admitting: Student

## 2023-10-01 DIAGNOSIS — M1711 Unilateral primary osteoarthritis, right knee: Secondary | ICD-10-CM | POA: Insufficient documentation

## 2023-10-01 MED ORDER — LIDOCAINE HCL 1 % IJ SOLN
4.0000 mL | INTRAMUSCULAR | Status: AC | PRN
  Administered 2023-10-01: 4 mL

## 2023-10-01 MED ORDER — METHYLPREDNISOLONE ACETATE 40 MG/ML IJ SUSP
40.0000 mg | INTRAMUSCULAR | Status: AC | PRN
  Administered 2023-10-01: 40 mg via INTRA_ARTICULAR

## 2023-10-01 NOTE — Progress Notes (Signed)
 Office Visit Note   Patient: Sean Stuart           Date of Birth: [birth year]           MRN: [id number] Visit Date: 10/01/2023              Requested by: Leigh Lung, MD [address] [place],  [place] [address] PCP: Leigh Lung, MD      HPI: Patient is a pleasant 89 year old gentleman with a history of bilateral osteoarthritis of his knee.  Last had a steroid injection by Leonce in April.  Into his right knee.  Comes in today requesting injection into his right knee and discussion of injection into the left knee Assessment & Plan: Visit Diagnoses:  1. Unilateral primary osteoarthritis, right knee     Plan: Went forward with injection of his right knee today he is a diabetic I explained to him that be happy to inject his other knee but he would have to wait in 2 weeks he will follow-up at that time for left knee injection  Follow-Up Instructions: 2 weeks  Ortho Exam  Patient is alert, oriented, no adenopathy, well-dressed, normal affect, normal respiratory effort. Right knee he has obvious clinically varus alignment he is neurovascular intact he has no effusion no erythema in the knee compartments are soft and compressible no evidence of any infective process negative T[place]a' sign    Imaging: No results found. No images are attached to the encounter.  Labs: Lab Results  Component Value Date   HGBA1C 7.0 07/10/2016   HGBA1C 7.1 04/10/2016   HGBA1C 8.0 (H) 01/09/2016     Lab Results  Component Value Date   ALBUMIN 4.0 01/09/2016   ALBUMIN 3.7 11/21/2015   ALBUMIN 4.1 10/18/2015    No results found for: MG No results found for: VD25OH  No results found for: PREALBUMIN    Latest Ref Rng & Units 06/18/2011   12:00 PM 03/25/2010    1:53 AM  CBC EXTENDED  WBC 4.0 - 10.5 K/uL  6.1   RBC 4.22 - 5.81 MIL/uL  4.81   Hemoglobin 13.0 - 17.0 g/dL 86.6  85.6   HCT 60.9 - 52.0 % 39.5  42.2   Platelets 150 - 400 K/uL  180   NEUT# 1.7 - 7.7 K/uL  4.7    Lymph# 0.7 - 4.0 K/uL  0.8      There is no height or weight on file to calculate BMI.  Orders:  No orders of the defined types were placed in this encounter.  No orders of the defined types were placed in this encounter.    Procedures: Large Joint Inj: R knee on 10/01/2023 3:28 PM Indications: pain and diagnostic evaluation Details: 25 G 1.5 in needle, anteromedial approach  Arthrogram: No  Medications: 40 mg methylPREDNISolone  acetate 40 MG/ML; 4 mL lidocaine  1 % Outcome: tolerated well, no immediate complications Procedure, treatment alternatives, risks and benefits explained, specific risks discussed. Consent was given by the patient.     Clinical Data: No additional findings.  ROS:  All other systems negative, except as noted in the HPI. Review of Systems  Objective: Vital Signs: There were no vitals taken for this visit.  Specialty Comments:  No specialty comments available.  PMFS History: Patient Active Problem List   Diagnosis Date Noted  . Unilateral primary osteoarthritis, right knee 10/01/2023  . Essential hypertension 10/18/2015  . Uncontrolled type 2 diabetes mellitus with hyperglycemia, without long-term current use  of insulin (HCC) 10/18/2015   Past Medical History:  Diagnosis Date  . Arthritis    knees  . Asthma   . Diabetes mellitus   . Hypertension   . Sleep apnea    STOP BANG score 5    Family History  Problem Relation Age of Onset  . Diabetes Mother   . CAD Father   . Anesthesia problems Neg Hx   . Hypotension Neg Hx   . Malignant hyperthermia Neg Hx   . Pseudochol deficiency Neg Hx     Past Surgical History:  Procedure Laterality Date  . CATARACT EXTRACTION W/PHACO  06/19/2011   Procedure: CATARACT EXTRACTION PHACO AND INTRAOCULAR LENS PLACEMENT (IOC);  Surgeon: Cherene Mania, MD;  Location: [facility];  Service: Ophthalmology;  Laterality: Left;  CDE: 12.83  . CATARACT EXTRACTION W/PHACO  07/07/2011   Procedure: CATARACT EXTRACTION  PHACO AND INTRAOCULAR LENS PLACEMENT (IOC);  Surgeon: Cherene Mania, MD;  Location: [facility];  Service: Ophthalmology;  Laterality: Right;  CDE 12.82  . LIPOMA EXCISION    . right leg surgery     from [facility]   Social History   Occupational History  . Not on file  Tobacco Use  . Smoking status: Former  . Smokeless tobacco: Never  Vaping Use  . Vaping status: Never Used  Substance and Sexual Activity  . Alcohol use: No    Comment: quit 1988  . Drug use: No  . Sexual activity: Not on file

## 2023-10-01 NOTE — Progress Notes (Signed)
   10/01/2023  Patient ID: Sean Stuart, male   DOB: 1934-10-25, 89 y.o.   MRN: [id number]  Contacted patient regarding referral for diabetes from Leigh Lung, MD .   Attempting to reschedule appointment. Unable to reach patient today, and could not leave a voicemail as the pt's box is full.   The patient is scheduled to see his PCP on 10/06/23. I will try to follow up with the patient post-appointment.   Heather Factor, PharmD Clinical Pharmacist  [phone]

## 2023-10-06 DIAGNOSIS — I4949 Other premature depolarization: Secondary | ICD-10-CM | POA: Diagnosis not present

## 2023-10-06 DIAGNOSIS — E1169 Type 2 diabetes mellitus with other specified complication: Secondary | ICD-10-CM | POA: Diagnosis not present

## 2023-10-06 DIAGNOSIS — E7800 Pure hypercholesterolemia, unspecified: Secondary | ICD-10-CM | POA: Diagnosis not present

## 2023-10-06 DIAGNOSIS — I10 Essential (primary) hypertension: Secondary | ICD-10-CM | POA: Diagnosis not present

## 2023-10-06 DIAGNOSIS — R350 Frequency of micturition: Secondary | ICD-10-CM | POA: Diagnosis not present

## 2023-10-08 NOTE — Progress Notes (Signed)
   10/08/2023  Patient ID: Sean Stuart, male   DOB: [birth year], 89 y.o.   MRN: [id number]  Contacted patient regarding referral for diabetes from Leigh Lung, MD .    Appointment scheduled  for 10/12/23 at 11:00am.  Heather Factor, PharmD Clinical Pharmacist  [phone]

## 2023-10-14 ENCOUNTER — Encounter (HOSPITAL_COMMUNITY): Payer: Self-pay | Admitting: Family Medicine

## 2023-10-15 ENCOUNTER — Ambulatory Visit (HOSPITAL_BASED_OUTPATIENT_CLINIC_OR_DEPARTMENT_OTHER): Admitting: Physician Assistant

## 2023-10-15 ENCOUNTER — Other Ambulatory Visit (HOSPITAL_COMMUNITY): Payer: Self-pay | Admitting: Family Medicine

## 2023-10-15 DIAGNOSIS — I7121 Aneurysm of the ascending aorta, without rupture: Secondary | ICD-10-CM

## 2023-10-20 DIAGNOSIS — I4949 Other premature depolarization: Secondary | ICD-10-CM | POA: Diagnosis not present

## 2023-10-20 DIAGNOSIS — E7800 Pure hypercholesterolemia, unspecified: Secondary | ICD-10-CM | POA: Diagnosis not present

## 2023-10-20 DIAGNOSIS — I10 Essential (primary) hypertension: Secondary | ICD-10-CM | POA: Diagnosis not present

## 2023-10-20 DIAGNOSIS — E1169 Type 2 diabetes mellitus with other specified complication: Secondary | ICD-10-CM | POA: Diagnosis not present

## 2023-10-22 ENCOUNTER — Ambulatory Visit (HOSPITAL_COMMUNITY)
Admission: RE | Admit: 2023-10-22 | Discharge: 2023-10-22 | Disposition: A | Discharge status: Home / Self Care | Source: Ambulatory Visit | Attending: Family Medicine | Admitting: Family Medicine

## 2023-10-22 DIAGNOSIS — E119 Type 2 diabetes mellitus without complications: Secondary | ICD-10-CM | POA: Diagnosis not present

## 2023-10-22 DIAGNOSIS — E785 Hyperlipidemia, unspecified: Secondary | ICD-10-CM | POA: Insufficient documentation

## 2023-10-22 DIAGNOSIS — I714 Abdominal aortic aneurysm, without rupture, unspecified: Secondary | ICD-10-CM | POA: Insufficient documentation

## 2023-10-22 DIAGNOSIS — I10 Essential (primary) hypertension: Secondary | ICD-10-CM | POA: Insufficient documentation

## 2023-10-22 DIAGNOSIS — I7121 Aneurysm of the ascending aorta, without rupture: Secondary | ICD-10-CM | POA: Diagnosis present

## 2023-10-29 NOTE — Progress Notes (Signed)
   10/29/2023  Patient ID: Sean Stuart, male   DOB: 07/12/1934, 89 y.o.   MRN: [id number]  Contacted patient regarding referral for diabetes from Leigh Lung, MD .    Left HIPAA compliant voicemail requesting that the patient return my phone call.   Heather Factor, PharmD Clinical Pharmacist  [phone]

## 2023-10-29 NOTE — Progress Notes (Signed)
   10/12/2023  Patient ID: Sean Stuart, male   DOB: Nov 04, 1934, 89 y.o.   MRN: [id number]  Attempted to contact patient for scheduled appointment for medication management. Left HIPAA compliant message for patient to return my call at their convenience.    Heather Factor, PharmD Clinical Pharmacist  [phone]

## 2023-11-06 ENCOUNTER — Telehealth: Payer: Self-pay

## 2023-11-06 NOTE — Progress Notes (Signed)
   11/05/23  Patient ID: Sean Stuart, male   DOB: April 09, 1934, 89 y.o.   MRN: [id number]  Contacted patient regarding referral for diabetes from Leigh Lung, MD .   Appointment scheduled for 11/06/23 at 3:00 pm.  Heather Factor, PharmD Clinical Pharmacist  [phone]

## 2023-11-06 NOTE — Progress Notes (Signed)
**Note Sean-Identified via Obfuscation** 11/06/2023 Name: Sean Stuart MRN: [id number] DOB: 02/20/1935  Chief Complaint  Patient presents with   Diabetes    Sean Stuart is a 89 y.o. year old male who presented for a telephone visit.   They were referred to the pharmacist by their PCP for assistance in managing diabetes and complex medication management.    Subjective: I called the patient today to review his medications and follow up on diabetes management. He reports adherence to his medications and checking his blood sugar twice daily.He is very active and plays golf multiple times a week, and bowls weekly as well.   Care Team: Primary Care Provider: Leigh Lung, Stuart ; Next Scheduled Visit: 12/15/23   Medication Access/Adherence  Current Pharmacy:  [facility] [address] [place] - [address] [place] [address] Phone: [phone] Fax: [phone]   Patient reports affordability concerns with their medications: No  Patient reports access/transportation concerns to their pharmacy: No  Patient reports adherence concerns with their medications:  No     Diabetes:  Current medications: glimepiride  4 mg daily, pioglitazone  15 mg daily  Patient did not have his log book with him to report blood sugar readings. His A1c was 7.5% at his last OV in July, a slight decrease from his previous A1c in April (7.8%)  Objective:  Medications Reviewed Today     Reviewed by Sean Stuart, [facility] (Pharmacist) on 11/06/23 at 1519  Med List Status: <None>   Medication Order Taking? Sig Documenting Provider Last Dose Status Informant  amLODipine-benazepril (LOTREL) 5-40 MG capsule [id number] Yes Take 1 capsule by mouth daily. Provider, Historical, Stuart  Active   aspirin EC 81 MG tablet [id number] Yes Take 81 mg by mouth every morning. Provider, Historical, Stuart  Active Self  atorvastatin (LIPITOR) 10 MG tablet [id number] Yes Take 10 mg by mouth daily. Provider, Historical, Stuart  Active    bimatoprost (LUMIGAN) 0.01 % SOLN [id number] Yes Place 1 drop into both eyes every morning. Provider, Historical, Stuart  Active Pharmacy Records           Med Note DELORAS, Stuart Stuart   Fri Nov 06, 2023  3:14 PM) Getting through PAP  clotrimazole-betamethasone (LOTRISONE) cream [id number] Yes Apply topically at bedtime. Provider, Historical, Stuart  Active   glimepiride  (AMARYL ) 4 MG tablet [id number] Yes TAKE 1 TABLET BY MOUTH ONCE DAILY BEFORE  Sean Stuart  Active   glucose blood (ONETOUCH VERIO) test strip [id number] Yes Use to check blood sugar 1 times per day. Sean Stuart  Active            Med Note DELORAS, [facility] Stuart   Fri Nov 06, 2023  3:16 PM) Pt reports having both meters  montelukast (SINGULAIR) 10 MG tablet [id number]  Take 10 mg by mouth daily.  Patient not taking: Reported on 11/06/2023   Provider, Historical, Stuart  Active   pioglitazone  (ACTOS ) 15 MG tablet [id number] Yes TAKE 1 TABLET BY MOUTH ONCE DAILY IN THE Sean Stuart  Active   PREVNAR 20 0.5 ML injection [id number] Yes  Provider, Historical, Stuart  Active   spironolactone (ALDACTONE) 25 MG tablet [id number] Yes Take 25 mg by mouth every morning. Provider, Historical, Stuart  Active            Plan: The patient requests that I call him back on Monday to complete the visit with his blood sugar logbook.   Stuart Sean, PharmD Clinical Pharmacist  [phone]    

## 2023-11-06 NOTE — Addendum Note (Signed)
 Addended by: GRAYLON KEEN D on: 11/06/2023 05:00 PM   Modules accepted: Orders

## 2023-11-09 NOTE — Progress Notes (Signed)
   11/09/2023 Name: Sean Stuart MRN: [id number] DOB: April 19, 1934  Chief Complaint  Patient presents with   Diabetes    EH SESAY is a 89 y.o. year old male who presented for a telephone visit.   They were referred to the pharmacist by their PCP for assistance in managing diabetes and complex medication management.    I called the patient today to finish his visit from Friday and review his glucose monitoring log. The patient is unable to find his log, and was having difficulties utilizing his glucometer this morning. He reports having the glucometer for well over a year.   I will contact his insurance plan to see which meter they cover, and collaborate with his PCP to get new prescriptions for his diabetic testing supplied. I will follow up with the patient once those are received, to continue with diabetes management.   Heather Factor, PharmD Clinical Pharmacist  [phone]

## 2023-11-12 NOTE — Telephone Encounter (Signed)
 Patient did not return application

## 2023-11-15 NOTE — Progress Notes (Unsigned)
 Cardiology Office Note    Date:  11/18/2023  ID:  [name], [name] July 08, 1934, MRN [id number] Cardiologist: Vina Gull, MD Cardiology APP:  Johnson Laymon HERO, PA-C { :  History of Present Illness:    Sean Stuart is a 89 y.o. male with past medical history of HTN, HLD and Type II DM who presents to the office today for evaluation of PAC's.   He was last examined by Dr. Gull in 01/2021 and denied any recent chest pain or dyspnea on exertion at that time. Was routinely bowling in a senior league.Given that he had coronary calcification by prior CT, an echocardiogram and Lexiscan  Myoview  were recommended for further assessment. NST showed frequent PAC's and equivocal inferior apical perfusion defect in the setting of breast attenuation but could not exclude a very minor area of ischemia. EF was read at 42% by NST but limited echocardiogram with Definity  showed his EF was at 55 to 60%. He did have borderline dilatation of the aortic root at 37 mm.  In talking with the patient today, he reports overall doing well since his last office visit. He remains active at baseline for his age and still enjoys golfing 2 days a week. Also goes fishing and participates in a bowling league. He denies any recent chest pain or dyspnea on exertion. No specific orthopnea or PND. Does experience intermittent lower extremity edema but reports adding salt to food routinely. He has overall been asymptomatic with his extra beats and denies any palpitations. Reports one episode of dizziness which occurred when getting off the golf cart several weeks ago but no presyncope or actual syncopal events. Says he consumes several bottles of water a day and occasional tea. No coffee, energy drinks or alcohol consumption.   Studies Reviewed:   EKG: EKG is not ordered today. EKG from 10/09/2023 is reviewed and shows sinus bradycardia, HR 59 with PAC's and LVH.   NST: 01/2021   Findings are equivocal. The study is  intermediate risk based on calculated LVEF, however visually this appears better.   No ST deviation was noted. Arrhythmias during stress: frequent PACs. The ECG was negative for ischemia.   LV perfusion is equivocal.  Small, mild intensity, inferoapical defect that is partially reversible at the very apex.  Soft tissue attenuation may well be contributing, however cannot exclude a minor ischemic territory.   Left ventricular function is abnormal. Global function is moderately reduced. Nuclear stress EF: 42 %. The left ventricular ejection fraction is moderately decreased (30-44%).   Intermediate risk study based on reduced calculated LVEF of 42%, although visually LVEF appears better.  There is an equivocal inferoapical perfusion defect in the setting of breast attenuation, cannot exclude a minor ischemic territory.  Suggest echocardiogram for further corroboration of LVEF.    Echocardiogram: 02/2021  1. Difficult apical windows Foreshortened. LVEF appears depresses. with  distal lateral hypokinesis, distal anterior and apcial akinesis. Would  consider limited echo with definity  to confirm . Left ventricular ejection  fraction, by estimation, is 45 to  50%. The left ventricle has mildly decreased function. The left  ventricular internal cavity size was mildly dilated. There is mild  concentric left ventricular hypertrophy. Left ventricular diastolic  parameters were normal.   2. Right ventricular systolic function is normal. The right ventricular  size is normal. There is normal pulmonary artery systolic pressure.   3. The mitral valve is normal in structure. Trivial mitral valve  regurgitation.   4. The aortic valve is  normal in structure. Aortic valve regurgitation is  mild.   5. The inferior vena cava is normal in size with greater than 50%  respiratory variability, suggesting right atrial pressure of 3 mmHg.   Limited Echo: 05/2021 IMPRESSIONS     1. Left ventricular ejection  fraction, by estimation, is 55 to 60%. The  left ventricle has normal function. The left ventricle has no regional  wall motion abnormalities. There is mild concentric left ventricular  hypertrophy. Left ventricular diastolic  function could not be evaluated.   2. Right ventricular systolic function was not well visualized.   3. The mitral valve was not well visualized.   4. The aortic valve was not assessed.   5. Aortic dilatation noted. There is borderline dilatation of the aortic  root, measuring 37 mm.    Physical Exam:   VS:  BP 128/72   Pulse (!) 54   Ht 6' 3 (1.905 m)   Wt 232 lb (105.2 kg)   SpO2 100%   BMI 29.00 kg/m    Wt Readings from Last 3 Encounters:  11/18/23 232 lb (105.2 kg)  01/21/21 229 lb (103.9 kg)  07/10/16 242 lb (109.8 kg)     GEN: Pleasant, elderly male appearing in no acute distress NECK: No JVD; No carotid bruits CARDIAC: RRR, no murmurs, rubs, gallops RESPIRATORY:  Clear to auscultation without rales, wheezing or rhonchi  ABDOMEN: Appears non-distended. No obvious abdominal masses. EXTREMITIES: No clubbing or cyanosis. Trace ankle edema bilaterally.  Distal pedal pulses are 2+ bilaterally.   Assessment and Plan:   1. PAC (premature atrial contraction) - Recent EKG with his PCP showed PAC's. This had been noted at the time of his prior NST. Recent labs showed normal electrolytes. He denies any specific palpitations. Reports one episode of dizziness and unclear if this is related to PAC's or possible orthostasis at that time as it occurs with positional changes.  - We reviewed the option of a 1 week Zio patch but he prefers to hold off on further monitoring at this time unless he has recurrent symptoms. I encouraged him to make us  aware if he has recurrent dizziness or develops palpitations as we can place a Zio patch for further assessment. Would not use an AV nodal blocking agent given his baseline heart rate in the 50's.  2. Essential  hypertension - BP is well-controlled at 128/72 during today's visit. Continue current medical therapy with Amlodipine-Benazepil 5-40mg  daily and Spironolactone 25 mg daily. Labs in 09/2023 showed his creatinine was at 1.36 and K+ at 3.8.  3. Hyperlipidemia LDL goal <70 - FLP in 09/2023 showed his LDL was at 46. Continue Atorvastatin 10 mg daily.  4. Abnormal NST - Prior Lexiscan  Myoview  in 01/2021 showed an equivocal inferior apical perfusion defect in the setting of breast attenuation but could not exclude a very minor area of ischemia and EF was read at 42% by NST but limited echocardiogram with Definity  showed his EF was at 55 to 60%.   - He remains very active for his age and denies any recent anginal symptoms. Continue ASA 81 mg daily and Atorvastatin 10 mg daily.  5. Abdominal aortic aneurysm - This measured 3.9 cm by recent ultrasound in 10/2023 with follow-up ultrasound recommended in 3 years for reassessment.    Signed, Laymon CHRISTELLA Qua, PA-C

## 2023-11-18 ENCOUNTER — Ambulatory Visit: Attending: Student | Admitting: Student

## 2023-11-18 ENCOUNTER — Encounter: Payer: Self-pay | Admitting: Student

## 2023-11-18 VITALS — BP 128/72 | HR 54 | Ht 75.0 in | Wt 232.0 lb

## 2023-11-18 DIAGNOSIS — I491 Atrial premature depolarization: Secondary | ICD-10-CM

## 2023-11-18 DIAGNOSIS — I10 Essential (primary) hypertension: Secondary | ICD-10-CM | POA: Diagnosis not present

## 2023-11-18 DIAGNOSIS — E785 Hyperlipidemia, unspecified: Secondary | ICD-10-CM | POA: Diagnosis not present

## 2023-11-18 DIAGNOSIS — R9439 Abnormal result of other cardiovascular function study: Secondary | ICD-10-CM

## 2023-11-18 DIAGNOSIS — I714 Abdominal aortic aneurysm, without rupture, unspecified: Secondary | ICD-10-CM | POA: Diagnosis not present

## 2023-11-18 NOTE — Patient Instructions (Signed)
 Medication Instructions:  Your physician recommends that you continue on your current medications as directed. Please refer to the Current Medication list given to you today.  *If you need a refill on your cardiac medications before your next appointment, please call your pharmacy*  Lab Work: NONE   If you have labs (blood work) drawn today and your tests are completely normal, you will receive your results only by: MyChart Message (if you have MyChart) OR A paper copy in the mail If you have any lab test that is abnormal or we need to change your treatment, we will call you to review the results.  Testing/Procedures: NONE   Follow-Up: At [facility], you and your health needs are our priority.  As part of our continuing mission to provide you with exceptional heart care, our providers are all part of one team.  This team includes your primary Cardiologist (physician) and Advanced Practice Providers or APPs (Physician Assistants and Nurse Practitioners) who all work together to provide you with the care you need, when you need it.  Your next appointment:   1 year(s)  Provider:   You may see Ola Berger, MD or one of the following Advanced Practice Providers on your designated Care Team:   Woodfin Hays, PA-C  [place], [place] Theotis Flake, [place]     We recommend signing up for the patient portal called "MyChart".  Sign up information is provided on this After Visit Summary.  MyChart is used to connect with patients for Virtual Visits (Telemedicine).  Patients are able to view lab/test results, encounter notes, upcoming appointments, etc.  Non-urgent messages can be sent to your provider as well.   To learn more about what you can do with MyChart, go to [email or web address].   Other Instructions Thank you for choosing [facility]!

## 2023-11-27 DIAGNOSIS — R030 Elevated blood-pressure reading, without diagnosis of hypertension: Secondary | ICD-10-CM | POA: Diagnosis not present

## 2023-11-27 DIAGNOSIS — J209 Acute bronchitis, unspecified: Secondary | ICD-10-CM | POA: Diagnosis not present

## 2023-11-27 NOTE — Progress Notes (Signed)
   11/27/2023 Name: Sean Stuart MRN: [id number] DOB: May 21, 1934  Chief Complaint  Patient presents with   Diabetes    Sean Stuart is a 89 y.o. year old male who presented for a telephone visit.   They were referred to the pharmacist by their PCP for assistance in managing diabetes and complex medication management.    I contacted the patient today to follow up but his voicemail is full so I could not leave a message. I will follow up next week.  Heather Factor, PharmD Clinical Pharmacist  [phone]

## 2023-11-30 DIAGNOSIS — I10 Essential (primary) hypertension: Secondary | ICD-10-CM | POA: Diagnosis not present

## 2023-11-30 DIAGNOSIS — I714 Abdominal aortic aneurysm, without rupture, unspecified: Secondary | ICD-10-CM | POA: Diagnosis not present

## 2023-11-30 DIAGNOSIS — E1169 Type 2 diabetes mellitus with other specified complication: Secondary | ICD-10-CM | POA: Diagnosis not present

## 2023-12-01 ENCOUNTER — Telehealth: Payer: Self-pay | Admitting: Physician Assistant

## 2023-12-01 NOTE — Telephone Encounter (Signed)
 Pt called asking to submit to insurance for right knee gel injection. Please call pt when approved at [phone]

## 2023-12-08 ENCOUNTER — Ambulatory Visit (INDEPENDENT_AMBULATORY_CARE_PROVIDER_SITE_OTHER): Admitting: Physician Assistant

## 2023-12-08 ENCOUNTER — Encounter: Payer: Self-pay | Admitting: Physician Assistant

## 2023-12-08 ENCOUNTER — Other Ambulatory Visit: Payer: Self-pay

## 2023-12-08 DIAGNOSIS — M1711 Unilateral primary osteoarthritis, right knee: Secondary | ICD-10-CM | POA: Diagnosis not present

## 2023-12-08 MED ORDER — HYALURONAN 88 MG/4ML IX SOSY
88.0000 mg | PREFILLED_SYRINGE | INTRA_ARTICULAR | Status: AC | PRN
  Administered 2023-12-08: 88 mg via INTRA_ARTICULAR

## 2023-12-08 NOTE — Telephone Encounter (Signed)
VOB has been submitted  

## 2023-12-08 NOTE — Progress Notes (Signed)
 Office Visit Note   Patient: Sean Stuart           Date of Birth: Feb 06, 1935           MRN: [id number] Visit Date: 12/08/2023              Requested by: Leigh Lung, MD [address] [place],  [place] [address] PCP: Leigh Lung, MD  Chief Complaint  Patient presents with  . Right Knee - Follow-up      HPI: Patient is a pleasant 89 year old gentleman who comes in for a Monovisc injection into his right knee no new injuries  Assessment & Plan: Visit Diagnoses:  1. Unilateral primary osteoarthritis, right knee     Plan: Went forward with Monovisc injection without difficulty may follow-up as needed informed patient of possible side effects and efficacy  Follow-Up Instructions: No follow-ups on file.   Ortho Exam  Patient is alert, oriented, no adenopathy, well-dressed, normal affect, normal respiratory effort. Right knee no effusion no erythema compartments are soft and compressible he is neurovascular intact    Imaging: No results found. No images are attached to the encounter.  Labs: Lab Results  Component Value Date   HGBA1C 7.0 07/10/2016   HGBA1C 7.1 04/10/2016   HGBA1C 8.0 (H) 01/09/2016     Lab Results  Component Value Date   ALBUMIN 4.0 01/09/2016   ALBUMIN 3.7 11/21/2015   ALBUMIN 4.1 10/18/2015    No results found for: MG No results found for: VD25OH  No results found for: PREALBUMIN    Latest Ref Rng & Units 06/18/2011   12:00 PM 03/25/2010    1:53 AM  CBC EXTENDED  WBC 4.0 - 10.5 K/uL  6.1   RBC 4.22 - 5.81 MIL/uL  4.81   Hemoglobin 13.0 - 17.0 g/dL 86.6  85.6   HCT 60.9 - 52.0 % 39.5  42.2   Platelets 150 - 400 K/uL  180   NEUT# 1.7 - 7.7 K/uL  4.7   Lymph# 0.7 - 4.0 K/uL  0.8      There is no height or weight on file to calculate BMI.  Orders:  No orders of the defined types were placed in this encounter.  No orders of the defined types were placed in this encounter.    Procedures: Large Joint Inj: R knee  on 12/08/2023 3:21 PM Indications: pain and diagnostic evaluation Details: 22 G 1.5 in needle, anteromedial approach  Arthrogram: No  Medications: 88 mg Hyaluronan 88 MG/4ML Outcome: tolerated well, no immediate complications Procedure, treatment alternatives, risks and benefits explained, specific risks discussed. Consent was given by the patient. Immediately prior to procedure a time out was called to verify the correct patient, procedure, equipment, support staff and site/side marked as required. Patient was prepped and draped in the usual sterile fashion.     Clinical Data: No additional findings.  ROS:  All other systems negative, except as noted in the HPI. Review of Systems  Objective: Vital Signs: There were no vitals taken for this visit.  Specialty Comments:  No specialty comments available.  PMFS History: Patient Active Problem List   Diagnosis Date Noted  . Unilateral primary osteoarthritis, right knee 10/01/2023  . Essential hypertension 10/18/2015  . Uncontrolled type 2 diabetes mellitus with hyperglycemia, without long-term current use of insulin (HCC) 10/18/2015   Past Medical History:  Diagnosis Date  . Arthritis    knees  . Asthma   . Diabetes mellitus   .  Hypertension   . Sleep apnea    STOP BANG score 5    Family History  Problem Relation Age of Onset  . Diabetes Mother   . CAD Father   . Anesthesia problems Neg Hx   . Hypotension Neg Hx   . Malignant hyperthermia Neg Hx   . Pseudochol deficiency Neg Hx     Past Surgical History:  Procedure Laterality Date  . CATARACT EXTRACTION W/PHACO  06/19/2011   Procedure: CATARACT EXTRACTION PHACO AND INTRAOCULAR LENS PLACEMENT (IOC);  Surgeon: Cherene Mania, MD;  Location: [facility];  Service: Ophthalmology;  Laterality: Left;  CDE: 12.83  . CATARACT EXTRACTION W/PHACO  07/07/2011   Procedure: CATARACT EXTRACTION PHACO AND INTRAOCULAR LENS PLACEMENT (IOC);  Surgeon: Cherene Mania, MD;  Location: [facility];  Service:  Ophthalmology;  Laterality: Right;  CDE 12.82  . LIPOMA EXCISION    . right leg surgery     from [facility]   Social History   Occupational History  . Not on file  Tobacco Use  . Smoking status: Former  . Smokeless tobacco: Never  Vaping Use  . Vaping status: Never Used  Substance and Sexual Activity  . Alcohol use: No    Comment: quit 1988  . Drug use: No  . Sexual activity: Not on file

## 2023-12-15 DIAGNOSIS — J069 Acute upper respiratory infection, unspecified: Secondary | ICD-10-CM | POA: Diagnosis not present

## 2023-12-15 DIAGNOSIS — E1169 Type 2 diabetes mellitus with other specified complication: Secondary | ICD-10-CM | POA: Diagnosis not present

## 2023-12-29 DIAGNOSIS — E1169 Type 2 diabetes mellitus with other specified complication: Secondary | ICD-10-CM | POA: Diagnosis not present

## 2023-12-29 DIAGNOSIS — J069 Acute upper respiratory infection, unspecified: Secondary | ICD-10-CM | POA: Diagnosis not present

## 2024-01-11 ENCOUNTER — Telehealth: Payer: Self-pay

## 2024-01-11 NOTE — Progress Notes (Signed)
   01/11/2024  Patient ID: Sean Stuart, male   DOB: 1934-05-18, 89 y.o.   MRN: [id number]  Contacted patient regarding referral for diabetes and medication access from Leigh Lung, MD .   Appointment scheduled for 01/28/24 at 11:00am  Heather Factor, PharmD Clinical Pharmacist  [phone]

## 2024-01-28 ENCOUNTER — Telehealth: Payer: Self-pay

## 2024-01-28 NOTE — Progress Notes (Signed)
   01/28/2024  Patient ID: Carlin LELON Matt, male   DOB: 1934/10/25, 89 y.o.   MRN: [id number]  Contacted patient regarding referral for diabetes, medication access, and medication management from Leigh Lung, MD .   I was unable to leave a message for the patient due to his mailbox being full. I will try again next week.   Heather Factor, PharmD Clinical Pharmacist  [phone]  Sig
